# Patient Record
Sex: Female | Born: 1965
Health system: Southern US, Community
[De-identification: ages and names within clinical notes are randomized; demographics above are authoritative.]

## PROBLEM LIST (undated history)

## (undated) DIAGNOSIS — Z8742 Personal history of other diseases of the female genital tract: Secondary | ICD-10-CM

## (undated) DIAGNOSIS — B379 Candidiasis, unspecified: Secondary | ICD-10-CM

## (undated) DIAGNOSIS — N920 Excessive and frequent menstruation with regular cycle: Secondary | ICD-10-CM

## (undated) DIAGNOSIS — E538 Deficiency of other specified B group vitamins: Secondary | ICD-10-CM

## (undated) DIAGNOSIS — Z8759 Personal history of other complications of pregnancy, childbirth and the puerperium: Secondary | ICD-10-CM

## (undated) DIAGNOSIS — Z8489 Family history of other specified conditions: Secondary | ICD-10-CM

## (undated) DIAGNOSIS — D649 Anemia, unspecified: Secondary | ICD-10-CM

## (undated) DIAGNOSIS — A749 Chlamydial infection, unspecified: Secondary | ICD-10-CM

## (undated) DIAGNOSIS — R011 Cardiac murmur, unspecified: Secondary | ICD-10-CM

## (undated) DIAGNOSIS — E669 Obesity, unspecified: Secondary | ICD-10-CM

## (undated) DIAGNOSIS — B009 Herpesviral infection, unspecified: Secondary | ICD-10-CM

## (undated) DIAGNOSIS — N979 Female infertility, unspecified: Secondary | ICD-10-CM

## (undated) HISTORY — DX: Obesity, unspecified: E66.9

## (undated) HISTORY — PX: LAPAROSCOPIC GELPORT ASSISTED MYOMECTOMY: SHX6549

## (undated) HISTORY — DX: Herpesviral infection, unspecified: B00.9

## (undated) HISTORY — DX: Female infertility, unspecified: N97.9

## (undated) HISTORY — PX: COLONOSCOPY: SHX174

## (undated) HISTORY — DX: Chlamydial infection, unspecified: A74.9

## (undated) HISTORY — DX: Personal history of other complications of pregnancy, childbirth and the puerperium: Z87.59

## (undated) HISTORY — PX: DILATION AND CURETTAGE OF UTERUS: SHX78

## (undated) HISTORY — PX: TUBAL LIGATION: SHX77

## (undated) HISTORY — DX: Personal history of other diseases of the female genital tract: Z87.42

## (undated) HISTORY — DX: Excessive and frequent menstruation with regular cycle: N92.0

## (undated) HISTORY — DX: Candidiasis, unspecified: B37.9

## (undated) HISTORY — PX: SALPINGECTOMY: SHX328

## (undated) HISTORY — DX: Deficiency of other specified B group vitamins: E53.8

## (undated) HISTORY — DX: Anemia, unspecified: D64.9

## (undated) SURGERY — Surgical Case
Anesthesia: *Unknown

---

## 1998-12-20 ENCOUNTER — Other Ambulatory Visit: Admission: RE | Admit: 1998-12-20 | Discharge: 1998-12-20 | Payer: Self-pay | Admitting: Obstetrics & Gynecology

## 2000-12-17 ENCOUNTER — Other Ambulatory Visit: Admission: RE | Admit: 2000-12-17 | Discharge: 2000-12-17 | Payer: Self-pay | Admitting: Obstetrics and Gynecology

## 2003-07-24 ENCOUNTER — Other Ambulatory Visit: Admission: RE | Admit: 2003-07-24 | Discharge: 2003-07-24 | Payer: Self-pay | Admitting: Obstetrics and Gynecology

## 2004-09-22 ENCOUNTER — Other Ambulatory Visit: Admission: RE | Admit: 2004-09-22 | Discharge: 2004-09-22 | Payer: Self-pay | Admitting: Obstetrics and Gynecology

## 2006-01-22 ENCOUNTER — Other Ambulatory Visit: Admission: RE | Admit: 2006-01-22 | Discharge: 2006-01-22 | Payer: Self-pay | Admitting: Obstetrics and Gynecology

## 2006-02-17 ENCOUNTER — Ambulatory Visit (HOSPITAL_COMMUNITY): Admission: RE | Admit: 2006-02-17 | Discharge: 2006-02-17 | Payer: Self-pay | Admitting: Obstetrics and Gynecology

## 2006-02-18 ENCOUNTER — Inpatient Hospital Stay (HOSPITAL_COMMUNITY): Admission: RE | Admit: 2006-02-18 | Discharge: 2006-02-18 | Payer: Self-pay | Admitting: Family Medicine

## 2006-02-19 ENCOUNTER — Inpatient Hospital Stay (HOSPITAL_COMMUNITY): Admission: AD | Admit: 2006-02-19 | Discharge: 2006-02-19 | Payer: Self-pay | Admitting: Obstetrics and Gynecology

## 2006-02-22 ENCOUNTER — Inpatient Hospital Stay (HOSPITAL_COMMUNITY): Admission: AD | Admit: 2006-02-22 | Discharge: 2006-02-22 | Payer: Self-pay | Admitting: Obstetrics and Gynecology

## 2006-02-25 ENCOUNTER — Inpatient Hospital Stay (HOSPITAL_COMMUNITY): Admission: AD | Admit: 2006-02-25 | Discharge: 2006-02-25 | Payer: Self-pay | Admitting: Obstetrics and Gynecology

## 2006-03-01 ENCOUNTER — Encounter: Admission: RE | Admit: 2006-03-01 | Discharge: 2006-03-01 | Payer: Self-pay | Admitting: Obstetrics and Gynecology

## 2006-03-04 ENCOUNTER — Inpatient Hospital Stay (HOSPITAL_COMMUNITY): Admission: AD | Admit: 2006-03-04 | Discharge: 2006-03-04 | Payer: Self-pay | Admitting: Obstetrics and Gynecology

## 2006-03-11 ENCOUNTER — Inpatient Hospital Stay (HOSPITAL_COMMUNITY): Admission: AD | Admit: 2006-03-11 | Discharge: 2006-03-11 | Payer: Self-pay | Admitting: Obstetrics and Gynecology

## 2009-05-01 ENCOUNTER — Emergency Department (HOSPITAL_COMMUNITY): Admission: EM | Admit: 2009-05-01 | Discharge: 2009-05-01 | Payer: Self-pay | Admitting: Emergency Medicine

## 2010-05-04 ENCOUNTER — Encounter: Payer: Self-pay | Admitting: Obstetrics and Gynecology

## 2010-06-30 LAB — DIFFERENTIAL
Basophils Absolute: 0 10*3/uL (ref 0.0–0.1)
Eosinophils Absolute: 0 10*3/uL (ref 0.0–0.7)
Lymphocytes Relative: 10 % — ABNORMAL LOW (ref 12–46)
Lymphs Abs: 0.9 10*3/uL (ref 0.7–4.0)
Monocytes Absolute: 0.5 10*3/uL (ref 0.1–1.0)

## 2010-06-30 LAB — URINALYSIS, ROUTINE W REFLEX MICROSCOPIC
Glucose, UA: NEGATIVE mg/dL
Hgb urine dipstick: NEGATIVE
Ketones, ur: 40 mg/dL — AB
Urobilinogen, UA: 0.2 mg/dL (ref 0.0–1.0)
pH: 8 (ref 5.0–8.0)

## 2010-06-30 LAB — POCT I-STAT, CHEM 8
BUN: 5 mg/dL — ABNORMAL LOW (ref 6–23)
Calcium, Ion: 1.13 mmol/L (ref 1.12–1.32)
Hemoglobin: 12.9 g/dL (ref 12.0–15.0)
Sodium: 140 mEq/L (ref 135–145)
TCO2: 24 mmol/L (ref 0–100)

## 2010-06-30 LAB — GLUCOSE, CAPILLARY: Glucose-Capillary: 66 mg/dL — ABNORMAL LOW (ref 70–99)

## 2010-06-30 LAB — CBC
HCT: 36 % (ref 36.0–46.0)
Hemoglobin: 11.8 g/dL — ABNORMAL LOW (ref 12.0–15.0)
MCV: 89.8 fL (ref 78.0–100.0)
Platelets: 222 10*3/uL (ref 150–400)
RBC: 4.01 MIL/uL (ref 3.87–5.11)
RDW: 14.2 % (ref 11.5–15.5)

## 2010-06-30 LAB — POCT PREGNANCY, URINE: Preg Test, Ur: NEGATIVE

## 2010-08-29 NOTE — Consult Note (Signed)
NAME:  Sarah Villa, Sarah Villa                  ACCOUNT NO.:  [id number]   MEDICAL RECORD NO.:  [id number]          PATIENT TYPE:  MAT   LOCATION:  [facility]                          FACILITY:  [facility]   PHYSICIAN:  Janine Limbo, M.D.DATE OF BIRTH:  1965-07-16   DATE OF CONSULTATION:  02/18/2006  DATE OF DISCHARGE:                                   CONSULTATION   HISTORY OF PRESENT ILLNESS:  Ms. Mccardle is a 45 year old female, gravida 7,  para 0-0-6-0, who presents with vaginal spotting, right lower quadrant pain,  and an early pregnancy test.  The patient has been followed at the [facility] division of [facility].  The patient's  last normal menstrual period was in September of 2007.  She had spotting on  January 22, 2006 that she thought was her cycle.  She continued to have  spotting off and on since that time.  She began having right lower quadrant  pain and presented to her family physician for evaluation.  The patient's  past history is significant for four prior ectopic pregnancies.  The patient  has had a left salpingectomy because of an ectopic pregnancy.  The patient  has a long history of infertility.  She has been evaluated by reproductive  endocrinologist.  They found the patient to have a right hydrosalpinx.  They  recommended that she have her right fallopian tube removed and that the  patient pursue in vitro fertilization.  The patient decided against that  recommendation and has simply not used contraception since that time.  The  patient denies nausea and vomiting.  She denies feelings of dizziness or  faintness.   OBSTETRICAL HISTORY:  The patient has had four ectopic pregnancies, as we  previously mentioned, and she has had two miscarriages.   DRUG ALLERGIES:  NO KNOWN DRUG ALLERGIES.   PAST MEDICAL HISTORY:  The patient has a history of ectopic pregnancies, as  mentioned above.  She denies hypertension and diabetes.   CURRENT MEDICATIONS:   Multivitamins, but nothing else.   SOCIAL HISTORY:  The patient is married.  She denies cigarette use, alcohol  use, and recreational drug use.   REVIEW OF SYSTEMS:  The patient has normal GI and GU function.   FAMILY HISTORY:  Noncontributory.   PHYSICAL EXAMINATION:  VITAL SIGNS:  Temperature is 98.6, pulse 77,  respirations 20, blood pressure is 116/83.  HEENT:  Within normal limits.  CHEST:  Clear.  HEART:  Regular rate and rhythm.  ABDOMEN:  Nontender.  EXTREMITIES:  Grossly normal.  PELVIC:  External genitalia is normal.  Vagina is normal.  Cervix is  nontender.  The uterus is upper limits normal size and nontender.  Adnexa:  There is fullness in the right adnexa but no discrete masses can be  palpated.  Rectovaginal exam deferred.   LABORATORY VALUES:  Blood type is A positive.  Quantitative HCG is 229.  Hemoglobin is 11.9, hematocrit is 35.6%, white blood cell count is 5400,  platelet count is 280,000.  SGOT is 19, BUN is 6, creatinine is  0.7.   An ultrasound showed a 3.9 x 2.3 x 2.2-cm complex mass in the right adnexa.  No intrauterine gestation was noted.  There was a thin stripe in the  endometrial cavity.  The right ovary appeared normal.   ASSESSMENT:  Right adnexal mass in a patient with a quantitative human  chorionic gonadotropin of 229.  Past history of multiple ectopic pregnancies  on the left and status post left salpingectomy.  Must rule out ectopic  pregnancy.   PLAN:  A long discussion was held with the patient and her husband.  The  patient is well aware of the natural history of ectopic pregnancies.  We  reviewed treatment options which included laparoscopy with salpingostomy,  laparoscopy with salpingectomy, observation, and methotrexate therapy.  The  patient very much desires to keep a normal intrauterine gestation.  She  understands that in the current clinical situation that it is unlikely that  she has a normal intrauterine gestation.   Nevertheless, she wants to be as  sure and she can be that there is no possibility that this will result in a  viable pregnancy.  She is willing to repeat her quantitative HCG on February 19, 2006.  She feels that if that quantitative HCG value is decreasing then  she will be ready to proceed with dilatation and curettage and methotrexate  therapy.  She also understands that if that HCG value is slightly elevated  for elevated in a more appropriate fashion, then a repeat HCG value may be  required on February 20, 2006 before a more definitive decision can be made.  The patient understands that between now and the time of definitive  information that her adnexal mass may measure 4 cm or greater at which point  methotrexate may not be recommended.  She understands at that point that  surgical management would be our option of choice.  She desires not to have  laparoscopy if at all possible, but she is willing to accept this risk to  better document that this is not a viable intrauterine gestation.  The  patient further understands that there can be worsening or rupture of the  ectopic pregnancy between now and then, and she knows to call for any  increase in pain or bleeding.  She will take ibuprofen, Tylenol, or Naprosyn  as needed for pain.  The patient was asked not to eat anything after 7  o'clock a.m. on February 19, 2006 in case surgery may be required.  The  results of the patient's quantitative HCG on February 19, 2006 will be called  to Dr. Pennie Rushing.  The patient will remain in maternity admissions at [facility] so that Dr. Pennie Rushing can discuss the results of the test and so that  further plans can be made based on those results.   In addition to the above, the patient was offered laparoscopy today so that  we could better assess visually the adnexa and in addition so that we could  proceed with salpingectomy since this was recommended in preparation for in vitro fertilization.   The patient and her husband have decided that they do  not want to proceed with salpingectomy unless absolutely necessary and that  it is likely that they will not pursue in vitro fertilization any further.      Janine Limbo, M.D.  Electronically Signed     AVS/MEDQ  D:  02/18/2006  T:  02/19/2006  Job:  [id number]   cc:  Stacie Acres Cliffton Asters, M.D.  Fax: [phone]   Hal Morales, M.D.  Fax: [phone]

## 2011-12-01 ENCOUNTER — Telehealth: Payer: Self-pay | Admitting: Obstetrics and Gynecology

## 2011-12-01 NOTE — Telephone Encounter (Signed)
Pt called complaining of spotting with iud. No heavy flow/changing pad every hour and no pelvic or abdominal pain. Informed patient that it is normal to have irregular bleeding/spotting with IUD. Advised pt to check for IUD string regularly and to call office if any pelvic/adominal pain and or heavy bleeding. Pt voices understanding Marily Memos willard, cma.

## 2012-02-03 ENCOUNTER — Ambulatory Visit (INDEPENDENT_AMBULATORY_CARE_PROVIDER_SITE_OTHER): Payer: PRIVATE HEALTH INSURANCE | Admitting: Obstetrics and Gynecology

## 2012-02-03 ENCOUNTER — Encounter: Payer: Self-pay | Admitting: Obstetrics and Gynecology

## 2012-02-03 VITALS — BP 112/68 | Ht 65.0 in | Wt 210.0 lb

## 2012-02-03 DIAGNOSIS — B379 Candidiasis, unspecified: Secondary | ICD-10-CM | POA: Insufficient documentation

## 2012-02-03 DIAGNOSIS — A749 Chlamydial infection, unspecified: Secondary | ICD-10-CM

## 2012-02-03 DIAGNOSIS — D649 Anemia, unspecified: Secondary | ICD-10-CM | POA: Insufficient documentation

## 2012-02-03 DIAGNOSIS — N920 Excessive and frequent menstruation with regular cycle: Secondary | ICD-10-CM

## 2012-02-03 DIAGNOSIS — N6459 Other signs and symptoms in breast: Secondary | ICD-10-CM

## 2012-02-03 DIAGNOSIS — R922 Inconclusive mammogram: Secondary | ICD-10-CM

## 2012-02-03 DIAGNOSIS — Z8742 Personal history of other diseases of the female genital tract: Secondary | ICD-10-CM

## 2012-02-03 DIAGNOSIS — Z8759 Personal history of other complications of pregnancy, childbirth and the puerperium: Secondary | ICD-10-CM | POA: Insufficient documentation

## 2012-02-03 DIAGNOSIS — B009 Herpesviral infection, unspecified: Secondary | ICD-10-CM

## 2012-02-03 DIAGNOSIS — Z124 Encounter for screening for malignant neoplasm of cervix: Secondary | ICD-10-CM

## 2012-02-03 NOTE — Progress Notes (Signed)
ANNUAL:  Last Pap: 04/26/2011 WNL: Yes Regular Periods:no Contraception: IUD  Monthly Breast exam:yes Tetanus<60yrs:yes Nl.Bladder Function:yes Daily BMs:yes Healthy Diet:yes Calcium:no Mammogram:yes Date of Mammogram: 01/2012 Exercise:yes Have often Exercise: occasional Seatbelt: yes Abuse at home: no Stressful work:yes Sigmoid-colonoscopy: n/a Bone Density: No PCP: Dr. Laurann Montana Change in PMH: None Change in [facility]: None  Subjective:    Sarah Villa is a 46 y.o. female, G5P0050, who presents for an annual exam.     History   Social History  . Marital Status: Single    Spouse Name: N/A    Number of Children: N/A  . Years of Education: N/A   Social History Main Topics  . Smoking status: Never Smoker   . Smokeless tobacco: None  . Alcohol Use: No  . Drug Use: No  . Sexually Active: Yes    Birth Control/ Protection: IUD   Other Topics Concern  . None   Social History Narrative  . None    Menstrual cycle:   LMP: No LMP recorded. Patient is not currently having periods (Reason: IUD).           Cycle: Faint cycle only.  No cramps The following portions of the patient's history were reviewed and updated as appropriate: allergies, current medications, past family history, past medical history, past social history, past surgical history and problem list.  Review of Systems Pertinent items are noted in HPI. Breast:Negative for breast lump,nipple discharge or nipple retraction Gastrointestinal: Negative for abdominal pain, change in bowel habits or rectal bleeding Urinary:negative   Objective:    BP 112/68  Ht 5\' 5"  (1.651 m)  Wt 210 lb (95.255 kg)  BMI 34.95 kg/m2    Weight:  Wt Readings from Last 1 Encounters:  02/03/12 210 lb (95.255 kg)          BMI: Body mass index is 34.95 kg/(m^2).  General Appearance: Alert, appropriate appearance for age. No acute distress HEENT: Grossly normal Neck / Thyroid: Supple, no masses, nodes or enlargement Lungs:  clear to auscultation bilaterally Back: No CVA tenderness Breast Exam: No masses or nodes.No dimpling, nipple retraction or discharge. Cardiovascular: Regular rate and rhythm. S1, S2, no murmur Gastrointestinal: Soft, non-tender, no masses or organomegaly Pelvic Exam: External genitalia: normal general appearance Vaginal: normal rugae Cervix: IUD string visualized Adnexa: no masses Uterus: normal single, nontender Rectovaginal:normal rectal, no masses Lymphatic Exam: Non-palpable nodes in neck, clavicular, axillary, or inguinal regions Skin: no rash or abnormalities Neurologic: Normal gait and speech, no tremor  Psychiatric: Alert and oriented, appropriate affect.   Wet Prep:not applicable Urinalysis:not applicable UPT: Not done   Assessment:    Normal gyn exam  Amenorrhea  with Mirena  Plan:    mammogram pap smear with HR HPV return annually or prn STD screening: declined Contraception:IUD Mirena   Dierdre Forth MD

## 2012-02-05 LAB — PAP IG AND HPV HIGH-RISK

## 2013-11-20 ENCOUNTER — Other Ambulatory Visit: Payer: Self-pay | Admitting: Obstetrics and Gynecology

## 2013-12-13 ENCOUNTER — Encounter (HOSPITAL_COMMUNITY): Payer: Self-pay | Admitting: *Deleted

## 2013-12-20 ENCOUNTER — Ambulatory Visit (HOSPITAL_COMMUNITY): Payer: 59

## 2013-12-20 ENCOUNTER — Encounter (HOSPITAL_COMMUNITY)
Admission: RE | Disposition: A | Discharge status: Home / Self Care | Payer: Self-pay | Source: Ambulatory Visit | Attending: Obstetrics and Gynecology

## 2013-12-20 ENCOUNTER — Ambulatory Visit (HOSPITAL_COMMUNITY): Payer: 59 | Admitting: Anesthesiology

## 2013-12-20 ENCOUNTER — Ambulatory Visit (HOSPITAL_COMMUNITY)
Admission: RE | Admit: 2013-12-20 | Discharge: 2013-12-20 | Disposition: A | Discharge status: Home / Self Care | Payer: 59 | Source: Ambulatory Visit | Attending: Obstetrics and Gynecology | Admitting: Obstetrics and Gynecology

## 2013-12-20 ENCOUNTER — Encounter (HOSPITAL_COMMUNITY): Payer: Self-pay | Admitting: Obstetrics and Gynecology

## 2013-12-20 ENCOUNTER — Encounter (HOSPITAL_COMMUNITY): Payer: 59 | Admitting: Anesthesiology

## 2013-12-20 DIAGNOSIS — N920 Excessive and frequent menstruation with regular cycle: Secondary | ICD-10-CM | POA: Diagnosis present

## 2013-12-20 DIAGNOSIS — Z5309 Procedure and treatment not carried out because of other contraindication: Secondary | ICD-10-CM | POA: Diagnosis not present

## 2013-12-20 DIAGNOSIS — J069 Acute upper respiratory infection, unspecified: Secondary | ICD-10-CM | POA: Insufficient documentation

## 2013-12-20 LAB — CBC
HEMATOCRIT: 37.7 % (ref 36.0–46.0)
Hemoglobin: 12.1 g/dL (ref 12.0–15.0)
MCH: 28.7 pg (ref 26.0–34.0)
MCHC: 32.1 g/dL (ref 30.0–36.0)
MCV: 89.3 fL (ref 78.0–100.0)
Platelets: 274 10*3/uL (ref 150–400)
RBC: 4.22 MIL/uL (ref 3.87–5.11)
RDW: 14.2 % (ref 11.5–15.5)
WBC: 8 10*3/uL (ref 4.0–10.5)

## 2013-12-20 LAB — PREGNANCY, URINE: Preg Test, Ur: NEGATIVE

## 2013-12-20 SURGERY — DILATATION & CURETTAGE/HYSTEROSCOPY WITH MYOSURE
Anesthesia: General

## 2013-12-20 MED ORDER — SCOPOLAMINE 1 MG/3DAYS TD PT72: 1.0000 | MEDICATED_PATCH | Freq: Once | TRANSDERMAL | Status: DC

## 2013-12-20 MED ORDER — LACTATED RINGERS IV SOLN: INTRAVENOUS | Status: DC

## 2013-12-20 SURGICAL SUPPLY — 20 items
BOOTIES KNEE HIGH SLOAN (MISCELLANEOUS) IMPLANT
CANISTER SUCT 3000ML (MISCELLANEOUS) IMPLANT
CATH ROBINSON RED A/P 16FR (CATHETERS) IMPLANT
CLOTH BEACON ORANGE TIMEOUT ST (SAFETY) IMPLANT
CONTAINER PREFILL 10% NBF 60ML (FORM) IMPLANT
DEVICE MYOSURE CLASSIC (MISCELLANEOUS) IMPLANT
DEVICE MYOSURE LITE (MISCELLANEOUS) IMPLANT
DILATOR CANAL MILEX (MISCELLANEOUS) IMPLANT
DRAPE HYSTEROSCOPY (DRAPE) IMPLANT
ELECT REM PT RETURN 9FT ADLT (ELECTROSURGICAL)
ELECTRODE REM PT RTRN 9FT ADLT (ELECTROSURGICAL) IMPLANT
FILTER ARTHROSCOPY CONVERTOR (FILTER) IMPLANT
GLOVE SURG SS PI 6.5 STRL IVOR (GLOVE) IMPLANT
GOWN STRL REUS W/TWL LRG LVL3 (GOWN DISPOSABLE) IMPLANT
PACK VAGINAL MINOR WOMEN LF (CUSTOM PROCEDURE TRAY) IMPLANT
PAD OB MATERNITY 4.3X12.25 (PERSONAL CARE ITEMS) IMPLANT
SEAL ROD LENS SCOPE MYOSURE (ABLATOR) IMPLANT
SET TUBING HYSTEROSCOPY 2 NDL (TUBING) IMPLANT
TOWEL OR 17X24 6PK STRL BLUE (TOWEL DISPOSABLE) IMPLANT
TUBE HYSTEROSCOPY W Y-CONNECT (TUBING) IMPLANT

## 2013-12-20 NOTE — Progress Notes (Signed)
Patient to Radiology for chest X Ray per Dr. Caralee Ates order.  Husband present with her.

## 2013-12-20 NOTE — H&P (Signed)
Sarah Villa is an 48 y.o. female. Presents for hysteroscopic resection of endometrial lesion.  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 Also had tubal ligation of remaining tube  Last mammogram: normal with heterogeneously dense breasts Date: 2015 Last pap: nl PAP, neg HR HPV Date: 2013 OB History: G5, P0     Menstrual History: Patient's last menstrual period was 11/12/2013.    Past Medical History  Diagnosis Date  . History of ectopic pregnancy   . Menorrhagia   . History of infertility, female   . Chlamydia   . Herpes   . Yeast infection   . Anemia   . Infertility, female     Past Surgical History  Procedure Laterality Date  . Salpingectomy    . Dilation and curettage of uterus    . Tubal ligation      Family History  Problem Relation Age of Onset  . Hypertension Mother   . Cancer Maternal Grandmother   . Heart disease Paternal Grandmother     Social History:  reports that she has never smoked. She does not have any smokeless tobacco history on file. She reports that she drinks alcohol. She reports that she does not use illicit drugs.  Allergies: No Known Allergies  No prescriptions prior to admission    Review of Systems  Constitutional:       Pt has been treated with antibiotics and prednisone for recent URI and is now afebrile  HENT: Negative.   Eyes: Negative.   Respiratory: Positive for cough and sputum production. Negative for hemoptysis, shortness of breath and wheezing.   Cardiovascular: Negative.   Gastrointestinal: Negative.   Genitourinary: Negative.   Musculoskeletal: Negative.   Skin: Negative.   Neurological: Negative.   Endo/Heme/Allergies:  Negative.   Psychiatric/Behavioral: Negative.     Last menstrual period 11/12/2013. Physical Exam  Constitutional: She is oriented to person, place, and time. She appears well-developed and well-nourished.  HENT:  Head: Normocephalic and atraumatic.  Eyes: Conjunctivae and EOM are normal.  Neck: Normal range of motion. Neck supple.  Cardiovascular: Normal rate and regular rhythm.   GI: Soft. Bowel sounds are normal.  Genitourinary:  Pelvic exam: VULVA: normal appearing vulva with no masses, tenderness or lesions, VAGINA: normal appearing vagina with normal color and discharge, no lesions, CERVIX: normal appearing cervix without discharge or lesions, IUD string noted, UTERUS: uterus is normal size, shape, consistency and nontender, enlarged to upper limits of normal size, irregular, ADNEXA: normal adnexa in size, nontender and no masses.  Musculoskeletal: Normal range of motion.  Neurological: She is alert and oriented to person, place, and time.  Skin: Skin is warm and dry.  Psychiatric: She has a normal mood and affect.    Results for orders placed during the hospital encounter of 12/20/13 (from the past 24 hour(s))  PREGNANCY, URINE     Status: None   Collection Time    12/20/13 11:50 AM      Result Value Ref Range   Preg Test, Ur NEGATIVE  NEGATIVE  CBC     Status: None   Collection Time    12/20/13 12:10 PM      Result Value Ref Range   WBC 8.0  4.0 - 10.5 K/uL   RBC 4.22  3.87 - 5.11 MIL/uL  Hemoglobin 12.1  12.0 - 15.0 g/dL   HCT 37.7  36.0 - 46.0 %   MCV 89.3  78.0 - 100.0 fL   MCH 28.7  26.0 - 34.0 pg   MCHC 32.1  30.0 - 36.0 g/dL   RDW 14.2  11.5 - 15.5 %   Platelets 274  150 - 400 K/uL    Dg Chest 2 View  12/20/2013   CLINICAL DATA:  Congestion, upper respiratory infection.  EXAM: CHEST  2 VIEW  COMPARISON:  None.  FINDINGS: The heart size and mediastinal contours are within normal limits. Both lungs are clear. The visualized skeletal structures are unremarkable.   IMPRESSION: No active cardiopulmonary disease.   Electronically Signed   By: Rolm Baptise M.D.   On: 12/20/2013 13:00   In office hysteroscopy:  Intracavitary mass, probable fibroid, 3.5 cm with >50%  within the endometrial cavity  Assessment/Plan: Menorrhagia resistant to Mirena use Probable intracavitary fibroid  Recommendation: Hysteroscopic resection of the intracavitary mass.  Indications, risks and benefits reviewed including risk of uterine perforation and need for discontinuation of procedure if that happens.  Pt has Mirena in place that may require removal during procedure. On presentation. The patient was noted to have had an upper respiratory infection for approximately 2 weeks. She is currently on her second course of antibiotics and has completed a 3 day course of oral steroids. Upon review by anesthesia. The recommendation was made to delay the patient's surgery because of the pulmonary risks of general anesthesia in the setting of an acute upper respiratory infection. The patient underwent a chest x-ray, which showed no pneumonia, and no acute pulmonary disease. She will followup with her primary care physician. HAYGOOD,VANESSA P 12/20/2013, 3:48 PM

## 2013-12-20 NOTE — Anesthesia Preprocedure Evaluation (Deleted)
Anesthesia Evaluation  Patient identified by MRN, date of birth, ID band Patient awake    Reviewed: Allergy & Precautions, H&P , Patient's Chart, lab work & pertinent test results  Airway Mallampati: II TM Distance: >3 FB Neck ROM: full    Dental   Pulmonary  breath sounds clear to auscultation        Cardiovascular Rhythm:regular Rate:Normal     Neuro/Psych    GI/Hepatic   Endo/Other    Renal/GU      Musculoskeletal   Abdominal   Peds  Hematology  (+) anemia ,   Anesthesia Other Findings   Reproductive/Obstetrics                           Anesthesia Physical Anesthesia Plan  ASA: II  Anesthesia Plan: General LMA   Post-op Pain Management:    Induction:   Airway Management Planned:   Additional Equipment:   Intra-op Plan:   Post-operative Plan:   Informed Consent: I have reviewed the patients History and Physical, chart, labs and discussed the procedure including the risks, benefits and alternatives for the proposed anesthesia with the patient or authorized representative who has indicated his/her understanding and acceptance.     Plan Discussed with:   Anesthesia Plan Comments:         Anesthesia Quick Evaluation

## 2014-02-06 ENCOUNTER — Other Ambulatory Visit: Payer: Self-pay | Admitting: Obstetrics and Gynecology

## 2014-02-12 ENCOUNTER — Encounter (HOSPITAL_COMMUNITY): Payer: Self-pay | Admitting: Obstetrics and Gynecology

## 2014-02-16 ENCOUNTER — Encounter (HOSPITAL_COMMUNITY): Payer: Self-pay | Admitting: *Deleted

## 2014-02-19 ENCOUNTER — Other Ambulatory Visit: Payer: Self-pay | Admitting: Obstetrics and Gynecology

## 2014-02-19 DIAGNOSIS — D250 Submucous leiomyoma of uterus: Secondary | ICD-10-CM | POA: Diagnosis present

## 2014-02-19 NOTE — H&P (Signed)
Expand All Collapse All   Sarah Villa is an 48 y.o. female. Presents for hysteroscopic resection of endometrial lesion.  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 Also had tubal ligation of remaining tube  Last mammogram: normal with heterogeneously dense breasts Date: 2015 Last pap: nl PAP, neg HR HPV Date: 2013 OB History: G5, P0   Menstrual History: Patient's last menstrual period was     Past Medical History  Diagnosis Date  . History of ectopic pregnancy   . Menorrhagia   . History of infertility, female   . Chlamydia   . Herpes   . Yeast infection   . Anemia   . Infertility, female     Past Surgical History  Procedure Laterality Date  . Salpingectomy    . Dilation and curettage of uterus    . Tubal ligation      Family History  Problem Relation Age of Onset  . Hypertension Mother   . Cancer Maternal Grandmother   . Heart disease Paternal Grandmother     Social History:  reports that she has never smoked. She does not have any smokeless tobacco history on file. She reports that she drinks alcohol. She reports that she does not use illicit drugs.  Allergies: No Known Allergies  No prescriptions prior to admission    Review of Systems  Constitutional:   Pt has been treated with antibiotics and prednisone for  URI inSeptember and is now afebrile and without any recurrent symptoms HENT: Negative.  Eyes: Negative.  Respiratory:  Negative for hemoptysis, shortness of breath and wheezing.  Cardiovascular: Negative.  Gastrointestinal: Negative.  Genitourinary: Negative.   Musculoskeletal: Negative.  Skin: Negative.  Neurological: Negative.  Endo/Heme/Allergies: Negative.  Psychiatric/Behavioral: Negative.    Last menstrual period  Physical Exam  Constitutional: She is oriented to person, place, and time. She appears well-developed and well-nourished.  HENT:  Head: Normocephalic and atraumatic.  Eyes: Conjunctivae and EOM are normal.  Neck: Normal range of motion. Neck supple.  Cardiovascular: Normal rate and regular rhythm.  GI: Soft. Bowel sounds are normal.  Genitourinary:  Pelvic exam: VULVA: normal appearing vulva with no masses, tenderness or lesions, VAGINA: normal appearing vagina with normal color and discharge, no lesions, CERVIX: normal appearing cervix without discharge or lesions, IUD string noted, UTERUS: uterus is normal size, shape, consistency and nontender, enlarged to upper limits of normal size, irregular, ADNEXA: normal adnexa in size, nontender and no masses.  Musculoskeletal: Normal range of motion.  Neurological: She is alert and oriented to person, place, and time.  Skin: Skin is warm and dry.  Psychiatric: She has a normal mood and affect.  Results for orders placed or performed during the hospital encounter of 02/20/14 (from the past 24 hour(s))  Pregnancy, urine     Status: None   Collection Time: 02/20/14  8:12 AM  Result Value Ref Range   Preg Test, Ur NEGATIVE NEGATIVE  CBC     Status: Abnormal   Collection Time: 02/20/14  8:15 AM  Result Value Ref Range   WBC 5.4 4.0 - 10.5 K/uL   RBC 4.18 3.87 - 5.11 MIL/uL   Hemoglobin 11.8 (L) 12.0 - 15.0 g/dL   HCT 37.1 36.0 - 46.0 %   MCV 88.8 78.0 - 100.0 fL   MCH  28.2 26.0 - 34.0 pg   MCHC 31.8 30.0 - 36.0 g/dL   RDW 13.9 11.5 - 15.5 %   Platelets 243 150 - 400 K/uL         Results for orders placed during the hospital encounter of 12/20/13 (from the past 24 hour(s))  PREGNANCY, URINE Status: None   Collection Time   12/20/13 11:50 AM    Result Value Ref Range   Preg Test, Ur NEGATIVE  NEGATIVE  CBC Status: None   Collection Time   12/20/13 12:10 PM   Result Value Ref Range   WBC 8.0  4.0 - 10.5 K/uL   RBC 4.22  3.87 - 5.11 MIL/uL   Hemoglobin 12.1  12.0 - 15.0 g/dL   HCT 37.7  36.0 - 46.0 %   MCV 89.3  78.0 - 100.0 fL   MCH 28.7  26.0 - 34.0 pg   MCHC 32.1  30.0 - 36.0 g/dL   RDW 14.2  11.5 - 15.5 %   Platelets 274  150 - 400 K/uL       Imaging Results    Dg Chest 2 View  12/20/2013 CLINICAL DATA: Congestion, upper respiratory infection. EXAM: CHEST 2 VIEW COMPARISON: None. FINDINGS: The heart size and mediastinal contours are within normal limits. Both lungs are clear. The visualized skeletal structures are unremarkable. IMPRESSION: No active cardiopulmonary disease. Electronically Signed By: Rolm Baptise M.D. On: 12/20/2013 13:00    In office sonohysterogram: Intracavitary mass, probable fibroid, 3.5 cm with >50% within the endometrial cavity  Assessment/Plan: Menorrhagia resistant to Mirena use Probable intracavitary fibroid  Recommendation: Hysteroscopic resection of the intracavitary mass is recommended and accepted. . Indications, risks and benefits reviewed including risk of uterine perforation and need for discontinuation of procedure if that happens.  There is also the possibility that the fibroid may be larger than expected and require more than 1 procedure for complete resection. Pt has Mirena in place that may require removal during procedure, or that string may be removed during resection.  She understands and wishes to proceed

## 2014-02-20 ENCOUNTER — Ambulatory Visit (HOSPITAL_COMMUNITY)
Admission: RE | Admit: 2014-02-20 | Discharge: 2014-02-20 | Disposition: A | Discharge status: Home / Self Care | Payer: 59 | Source: Ambulatory Visit | Attending: Obstetrics and Gynecology | Admitting: Obstetrics and Gynecology

## 2014-02-20 ENCOUNTER — Encounter (HOSPITAL_COMMUNITY): Payer: Self-pay | Admitting: Anesthesiology

## 2014-02-20 ENCOUNTER — Encounter (HOSPITAL_COMMUNITY)
Admission: RE | Disposition: A | Discharge status: Home / Self Care | Payer: Self-pay | Source: Ambulatory Visit | Attending: Obstetrics and Gynecology

## 2014-02-20 ENCOUNTER — Ambulatory Visit (HOSPITAL_COMMUNITY): Payer: 59 | Admitting: Anesthesiology

## 2014-02-20 DIAGNOSIS — D259 Leiomyoma of uterus, unspecified: Secondary | ICD-10-CM | POA: Diagnosis not present

## 2014-02-20 DIAGNOSIS — Z975 Presence of (intrauterine) contraceptive device: Secondary | ICD-10-CM | POA: Diagnosis not present

## 2014-02-20 DIAGNOSIS — D649 Anemia, unspecified: Secondary | ICD-10-CM | POA: Diagnosis not present

## 2014-02-20 DIAGNOSIS — A6000 Herpesviral infection of urogenital system, unspecified: Secondary | ICD-10-CM | POA: Insufficient documentation

## 2014-02-20 DIAGNOSIS — N920 Excessive and frequent menstruation with regular cycle: Secondary | ICD-10-CM | POA: Insufficient documentation

## 2014-02-20 DIAGNOSIS — D250 Submucous leiomyoma of uterus: Secondary | ICD-10-CM | POA: Diagnosis present

## 2014-02-20 HISTORY — PX: DILATATION & CURETTAGE/HYSTEROSCOPY WITH MYOSURE: SHX6511

## 2014-02-20 LAB — CBC
HEMATOCRIT: 37.1 % (ref 36.0–46.0)
Hemoglobin: 11.8 g/dL — ABNORMAL LOW (ref 12.0–15.0)
MCH: 28.2 pg (ref 26.0–34.0)
MCHC: 31.8 g/dL (ref 30.0–36.0)
MCV: 88.8 fL (ref 78.0–100.0)
Platelets: 243 10*3/uL (ref 150–400)
RBC: 4.18 MIL/uL (ref 3.87–5.11)
RDW: 13.9 % (ref 11.5–15.5)
WBC: 5.4 10*3/uL (ref 4.0–10.5)

## 2014-02-20 LAB — PREGNANCY, URINE: Preg Test, Ur: NEGATIVE

## 2014-02-20 SURGERY — DILATATION & CURETTAGE/HYSTEROSCOPY WITH MYOSURE
Anesthesia: General | Site: Vagina

## 2014-02-20 MED ORDER — MEPERIDINE HCL 25 MG/ML IJ SOLN: 6.2500 mg | INTRAMUSCULAR | Status: DC | PRN

## 2014-02-20 MED ORDER — KETOROLAC TROMETHAMINE 60 MG/2ML IM SOLN
INTRAMUSCULAR | Status: DC | PRN
  Administered 2014-02-20: 30 mg via INTRAMUSCULAR

## 2014-02-20 MED ORDER — ONDANSETRON HCL 4 MG/2ML IJ SOLN
INTRAMUSCULAR | Status: AC
  Filled 2014-02-20: qty 2

## 2014-02-20 MED ORDER — PROPOFOL 10 MG/ML IV EMUL
INTRAVENOUS | Status: AC
  Filled 2014-02-20: qty 20

## 2014-02-20 MED ORDER — VASOPRESSIN 20 UNIT/ML IV SOLN
INTRAVENOUS | Status: AC
  Filled 2014-02-20: qty 1

## 2014-02-20 MED ORDER — CEFAZOLIN (ANCEF) 1 G IV SOLR: 2.0000 g | INTRAVENOUS | Status: DC

## 2014-02-20 MED ORDER — LIDOCAINE HCL 2 % IJ SOLN
INTRAMUSCULAR | Status: AC
  Filled 2014-02-20: qty 20

## 2014-02-20 MED ORDER — SCOPOLAMINE 1 MG/3DAYS TD PT72
MEDICATED_PATCH | TRANSDERMAL | Status: AC
  Administered 2014-02-20: 1.5 mg via TRANSDERMAL
  Filled 2014-02-20: qty 1

## 2014-02-20 MED ORDER — CEFAZOLIN SODIUM-DEXTROSE 2-3 GM-% IV SOLR
2.0000 g | Freq: Once | INTRAVENOUS | Status: AC
  Administered 2014-02-20: 2 g via INTRAVENOUS
  Filled 2014-02-20: qty 50

## 2014-02-20 MED ORDER — PROMETHAZINE HCL 25 MG/ML IJ SOLN
INTRAMUSCULAR | Status: AC
  Filled 2014-02-20: qty 1

## 2014-02-20 MED ORDER — FENTANYL CITRATE 0.05 MG/ML IJ SOLN
INTRAMUSCULAR | Status: DC | PRN
  Administered 2014-02-20 (×2): 25 ug via INTRAVENOUS
  Administered 2014-02-20: 50 ug via INTRAVENOUS

## 2014-02-20 MED ORDER — SODIUM CHLORIDE 0.9 % IJ SOLN
INTRAMUSCULAR | Status: AC
  Filled 2014-02-20: qty 50

## 2014-02-20 MED ORDER — FENTANYL CITRATE 0.05 MG/ML IJ SOLN
25.0000 ug | INTRAMUSCULAR | Status: DC | PRN
  Administered 2014-02-20: 50 ug via INTRAVENOUS

## 2014-02-20 MED ORDER — LIDOCAINE HCL (CARDIAC) 20 MG/ML IV SOLN
INTRAVENOUS | Status: AC
  Filled 2014-02-20: qty 5

## 2014-02-20 MED ORDER — GLYCOPYRROLATE 0.2 MG/ML IJ SOLN
INTRAMUSCULAR | Status: DC | PRN
  Administered 2014-02-20: 0.1 mg via INTRAVENOUS

## 2014-02-20 MED ORDER — KETOROLAC TROMETHAMINE 30 MG/ML IJ SOLN
INTRAMUSCULAR | Status: AC
Start: 2014-02-20 — End: 2014-02-20
  Filled 2014-02-20: qty 1

## 2014-02-20 MED ORDER — DEXAMETHASONE SODIUM PHOSPHATE 10 MG/ML IJ SOLN
INTRAMUSCULAR | Status: AC
  Filled 2014-02-20: qty 1

## 2014-02-20 MED ORDER — FENTANYL CITRATE 0.05 MG/ML IJ SOLN
INTRAMUSCULAR | Status: AC
  Filled 2014-02-20: qty 5

## 2014-02-20 MED ORDER — FENTANYL CITRATE 0.05 MG/ML IJ SOLN
INTRAMUSCULAR | Status: AC
  Filled 2014-02-20: qty 2

## 2014-02-20 MED ORDER — KETOROLAC TROMETHAMINE 30 MG/ML IJ SOLN: 15.0000 mg | Freq: Once | INTRAMUSCULAR | Status: DC | PRN

## 2014-02-20 MED ORDER — HYDROCODONE-ACETAMINOPHEN 5-325 MG PO TABS: 1.0000 | ORAL_TABLET | Freq: Four times a day (QID) | ORAL | Status: DC | PRN

## 2014-02-20 MED ORDER — KETOROLAC TROMETHAMINE 30 MG/ML IJ SOLN
INTRAMUSCULAR | Status: DC | PRN
  Administered 2014-02-20: 30 mg via INTRAVENOUS

## 2014-02-20 MED ORDER — SODIUM CHLORIDE 0.9 % IV SOLN
INTRAVENOUS | Status: DC | PRN
  Administered 2014-02-20: 20 mL via INTRAMUSCULAR

## 2014-02-20 MED ORDER — DEXAMETHASONE SODIUM PHOSPHATE 10 MG/ML IJ SOLN
INTRAMUSCULAR | Status: DC | PRN
  Administered 2014-02-20: 10 mg via INTRAVENOUS

## 2014-02-20 MED ORDER — LACTATED RINGERS IV SOLN
INTRAVENOUS | Status: DC
  Administered 2014-02-20 (×2): via INTRAVENOUS

## 2014-02-20 MED ORDER — SCOPOLAMINE 1 MG/3DAYS TD PT72
1.0000 | MEDICATED_PATCH | Freq: Once | TRANSDERMAL | Status: DC
  Administered 2014-02-20: 1.5 mg via TRANSDERMAL

## 2014-02-20 MED ORDER — SODIUM CHLORIDE 0.9 % IR SOLN
Status: DC | PRN
  Administered 2014-02-20 (×4): 3000 mL

## 2014-02-20 MED ORDER — IBUPROFEN 600 MG PO TABS: ORAL_TABLET | ORAL | Status: DC

## 2014-02-20 MED ORDER — PROMETHAZINE HCL 25 MG PO TABS: 25.0000 mg | ORAL_TABLET | Freq: Four times a day (QID) | ORAL | Status: DC | PRN

## 2014-02-20 MED ORDER — EPHEDRINE SULFATE 50 MG/ML IJ SOLN
INTRAMUSCULAR | Status: DC | PRN
  Administered 2014-02-20 (×4): 5 mg via INTRAVENOUS

## 2014-02-20 MED ORDER — MIDAZOLAM HCL 5 MG/5ML IJ SOLN
INTRAMUSCULAR | Status: DC | PRN
  Administered 2014-02-20: 2 mg via INTRAVENOUS

## 2014-02-20 MED ORDER — MIDAZOLAM HCL 2 MG/2ML IJ SOLN: 0.5000 mg | Freq: Once | INTRAMUSCULAR | Status: DC | PRN

## 2014-02-20 MED ORDER — PROMETHAZINE HCL 25 MG/ML IJ SOLN: 6.2500 mg | INTRAMUSCULAR | Status: DC | PRN

## 2014-02-20 MED ORDER — LIDOCAINE HCL (CARDIAC) 20 MG/ML IV SOLN
INTRAVENOUS | Status: DC | PRN
  Administered 2014-02-20: 40 mg via INTRAVENOUS

## 2014-02-20 MED ORDER — CEFAZOLIN SODIUM-DEXTROSE 2-3 GM-% IV SOLR
INTRAVENOUS | Status: AC
  Filled 2014-02-20: qty 50

## 2014-02-20 MED ORDER — LIDOCAINE HCL 2 % IJ SOLN
INTRAMUSCULAR | Status: DC | PRN
  Administered 2014-02-20: 10 mL

## 2014-02-20 MED ORDER — PROPOFOL 10 MG/ML IV BOLUS
INTRAVENOUS | Status: DC | PRN
  Administered 2014-02-20: 150 mg via INTRAVENOUS

## 2014-02-20 MED ORDER — ONDANSETRON HCL 4 MG/2ML IJ SOLN
INTRAMUSCULAR | Status: DC | PRN
  Administered 2014-02-20: 4 mg via INTRAVENOUS

## 2014-02-20 MED ORDER — PROMETHAZINE HCL 25 MG/ML IJ SOLN
6.2500 mg | INTRAMUSCULAR | Status: DC | PRN
  Administered 2014-02-20: 6.25 mg via INTRAVENOUS

## 2014-02-20 MED ORDER — MIDAZOLAM HCL 2 MG/2ML IJ SOLN
INTRAMUSCULAR | Status: AC
  Filled 2014-02-20: qty 2

## 2014-02-20 MED ORDER — EPHEDRINE 5 MG/ML INJ
INTRAVENOUS | Status: AC
  Filled 2014-02-20: qty 10

## 2014-02-20 SURGICAL SUPPLY — 24 items
BOOTIES KNEE HIGH SLOAN (MISCELLANEOUS) ×4 IMPLANT
CANISTER SUCT 3000ML (MISCELLANEOUS) ×10 IMPLANT
CATH ROBINSON RED A/P 16FR (CATHETERS) ×2 IMPLANT
CLOTH BEACON ORANGE TIMEOUT ST (SAFETY) ×2 IMPLANT
CONTAINER PREFILL 10% NBF 60ML (FORM) ×4 IMPLANT
DECANTER SPIKE VIAL GLASS SM (MISCELLANEOUS) ×4 IMPLANT
DEVICE MYOSURE CLASSIC (MISCELLANEOUS) IMPLANT
DEVICE MYOSURE LITE (MISCELLANEOUS) IMPLANT
DILATOR CANAL MILEX (MISCELLANEOUS) IMPLANT
ELECT REM PT RETURN 9FT ADLT (ELECTROSURGICAL) ×2
ELECTRODE REM PT RTRN 9FT ADLT (ELECTROSURGICAL) ×1 IMPLANT
FILTER ARTHROSCOPY CONVERTOR (FILTER) ×2 IMPLANT
GLOVE SURG SS PI 6.5 STRL IVOR (GLOVE) ×4 IMPLANT
GOWN STRL REUS W/TWL LRG LVL3 (GOWN DISPOSABLE) ×4 IMPLANT
MYOSURE XL FIBROID REM (MISCELLANEOUS) ×2
PACK VAGINAL MINOR WOMEN LF (CUSTOM PROCEDURE TRAY) ×2 IMPLANT
PAD OB MATERNITY 4.3X12.25 (PERSONAL CARE ITEMS) ×2 IMPLANT
SEAL ROD LENS SCOPE MYOSURE (ABLATOR) ×2 IMPLANT
SET TUBING HYSTEROSCOPY 2 NDL (TUBING) IMPLANT
SYR TB 1ML 25GX5/8 (SYRINGE) ×2 IMPLANT
SYRINGE CONTROL L 12CC (SYRINGE) ×2 IMPLANT
SYSTEM TISS REMOVAL MYSR XL RM (MISCELLANEOUS) ×1 IMPLANT
TOWEL OR 17X24 6PK STRL BLUE (TOWEL DISPOSABLE) ×4 IMPLANT
TUBE HYSTEROSCOPY W Y-CONNECT (TUBING) IMPLANT

## 2014-02-20 NOTE — Op Note (Signed)
02/20/2014  11:09 AM  PATIENT:  Sarah Villa  48 y.o. female  PRE-OPERATIVE DIAGNOSIS:  Endometrial mass, Menorrhagia  POST-OPERATIVE DIAGNOSIS:  Endometrial mass, Menorrhagia,  intracavitary fibroid  PROCEDURE:  Procedure(s): DILATATION & CURETTAGE/HYSTEROSCOPY WITH MYOSURE RESECTION OF FIBROID  SURGEON:  Nanette Wirsing P, MD  ASSISTANTS: NONE  ANESTHESIA:   general  LMA  ESTIMATED BLOOD LOSS: 20 cc  BLOOD ADMINISTERED:none  COMPLICATIONS:none  FINDINGS: the uterus sounded to 10 cm. There was a 3-4 cm uterine fibroid emanating from the left lateral wall of the endometrial cavity which was almost completely relieved within the endometrial cavity. There was a smaller right fundal uterine fibroid measuring approximately 1 cm. The tubal ostia could be visualized after resection of the large fibroid. A Mirena intrauterine contraceptive device was in place in the endometrial cavity.  FLUID DEFICIT:  1590cc  . IRRIGANT USED:  NORMAL SALINE FOR A TOTAL OF 5 three liter bags  LOCAL MEDICATIONS USED:  XYLOCAINE  and Amount: 10 ml  OTHER INTRAOPERATIVE MEDICATIONS: Ancef 2 g IV on-call to the operating room        Toradol 30 mg IV and 30 mg IM near the termination of the case        Pitressin 20 units in 50 cc of saline in a paracervical location for a total of 10 cc prior to beginning the resection  SPECIMEN:  Source of Specimen:  uterine fibroids  DISPOSITION OF SPECIMEN:  PATHOLOGY  COUNTS:  YES  DESCRIPTION OF PROCEDURE:the patient was taken to the operating room after appropriate identification and placed on the operating table. After the attainment of adequate general anesthesia she was placed in the lithotomy position. The perineum and vagina were prepped with multiple layers of Betadine. The bladder was emptied with a an in and out catheter. The perineum was draped in sterile field. A gray speculum was placed in the vagina. The cervix was grasped with a single-tooth  tenaculum. A paracervical block was achieved with a total of 10 cc of 2% Xylocaine and the 5 and 7:00 positions.  10 cc of dilute Pitressin was injected into a similar paracervical space. The uterus was sounded to 10 cm.  The cervix was then dilated to accommodate the diagnostic hysteroscope. The hysteroscope was used to evaluate all quadrants of the uterus.  The above-noted findings were made and documented. The Myosure apparatus was placed through the outflow channel of the diagnostic Myosure hysteroscope and resection of the small myoma undertaken to completion. The large myoma was then resected over the next 17 minutes of cut time.  During this procedure. The string attached to the IUD was suctioned into the Myosure apparatus and cut. However, the IUD was not damaged.  At completion of the resection. Both tubal ostia could then be visualized and hemostasis was noted to be adequate. All instruments were then removed from the endometrial cavity . After documentation of the resection was performed. All instruments were then removed from the vagina and the patient was awakened from general anesthesia and taken to the recovery room in satisfactory condition having tolerated the procedure well sponge and instrument counts correct. The IUD was left in place as had been discussed with the patient in the event that the string was cut for removal at its appropriate five-year limit. She understands this may require an office hysteroscopy for removal.  PLAN OF CARE: discharge home after postanesthesia care  PATIENT DISPOSITION:  PACU - hemodynamically stable.   Delay start of Pharmacological VTE agent (>  24hrs) due to surgical blood loss or risk of bleeding:  not applicable.  SCD hose used during the procedure.   Eldred Manges, MD 11:09 AM

## 2014-02-20 NOTE — Discharge Instructions (Signed)
DISCHARGE INSTRUCTIONS: HYSTEROSCOPY / ENDOMETRIAL ABLATION The following instructions have been prepared to help you care for yourself upon your return home.  May remove Scop patch on or before 02/22/2014.  May take Ibuprofen after 4:40 p.m. As needed for cramps  Personal hygiene:  Use sanitary pads for vaginal drainage, not tampons.  Shower the day after your procedure.  NO tub baths, pools or Jacuzzis for 2-3 weeks.  Wipe front to back after using the bathroom.  Activity and limitations:  Do NOT drive or operate any equipment for 24 hours. The effects of anesthesia are still present and drowsiness may result.  Do NOT rest in bed all day.  Walking is encouraged.  Walk up and down stairs slowly.  You may resume your normal activity in one to two days or as indicated by your physician. Sexual activity: NO intercourse for at least 2 weeks after the procedure, or as indicated by your Doctor.  Diet: Eat a light meal as desired this evening. You may resume your usual diet tomorrow.  Return to Work: You may resume your work activities in one to two days or as indicated by Marine scientist.  What to expect after your surgery: Expect to have vaginal bleeding/discharge for 2-3 days and spotting for up to 10 days. It is not unusual to have soreness for up to 1-2 weeks. You may have a slight burning sensation when you urinate for the first day. Mild cramps may continue for a couple of days. You may have a regular period in 2-6 weeks.  Call your doctor for any of the following:  Excessive vaginal bleeding or clotting, saturating and changing one pad every hour.  Inability to urinate 6 hours after discharge from hospital.  Pain not relieved by pain medication.  Fever of 100.4 F or greater.  Unusual vaginal discharge or odor.  Return to office _________________Call for an appointment ___________________ Patients signature: ______________________ Nurses signature  ________________________  [facility] [phone]

## 2014-02-20 NOTE — Transfer of Care (Signed)
Immediate Anesthesia Transfer of Care Note  Patient: Sarah Villa  Procedure(s) Performed: Procedure(s): DILATATION & CURETTAGE/HYSTEROSCOPY WITH MYOSURE (N/A)  Patient Location: PACU  Anesthesia Type:General  Level of Consciousness: awake, alert  and oriented  Airway & Oxygen Therapy: Patient Spontanous Breathing and Patient connected to nasal cannula oxygen  Post-op Assessment: Report given to PACU RN and Post -op Vital signs reviewed and stable  Post vital signs: Reviewed and stable  Complications: No apparent anesthesia complications

## 2014-02-20 NOTE — Anesthesia Preprocedure Evaluation (Addendum)
Anesthesia Evaluation  Patient identified by MRN, date of birth, ID band Patient awake    Reviewed: Allergy & Precautions, H&P , NPO status , Patient's Chart, lab work & pertinent test results, reviewed documented beta blocker date and time   History of Anesthesia Complications Negative for: history of anesthetic complications  Airway Mallampati: I  TM Distance: >3 FB Neck ROM: full    Dental  (+) Teeth Intact   Pulmonary neg pulmonary ROS,  breath sounds clear to auscultation  Pulmonary exam normal       Cardiovascular negative cardio ROS  Rhythm:regular Rate:Normal     Neuro/Psych negative neurological ROS  negative psych ROS   GI/Hepatic negative GI ROS, Neg liver ROS,   Endo/Other  BMI 36.1  Renal/GU negative Renal ROS  Female GU complaint     Musculoskeletal   Abdominal   Peds  Hematology  (+) anemia ,   Anesthesia Other Findings   Reproductive/Obstetrics negative OB ROS                            Anesthesia Physical  Anesthesia Plan  ASA: II  Anesthesia Plan: General LMA   Post-op Pain Management:    Induction:   Airway Management Planned:   Additional Equipment:   Intra-op Plan:   Post-operative Plan:   Informed Consent: I have reviewed the patients History and Physical, chart, labs and discussed the procedure including the risks, benefits and alternatives for the proposed anesthesia with the patient or authorized representative who has indicated his/her understanding and acceptance.   Dental Advisory Given  Plan Discussed with: CRNA and Surgeon  Anesthesia Plan Comments:         Anesthesia Quick Evaluation

## 2014-02-20 NOTE — Anesthesia Postprocedure Evaluation (Signed)
  Anesthesia Post Note  Patient: Sarah Villa  Procedure(s) Performed: Procedure(s) (LRB): DILATATION & CURETTAGE/HYSTEROSCOPY WITH MYOSURE (N/A)  Anesthesia type: GA  Patient location: PACU  Post pain: Pain level controlled  Post assessment: Post-op Vital signs reviewed  Last Vitals:  Filed Vitals:   02/20/14 1200  BP: 115/66  Pulse: 79  Temp:   Resp: 18    Post vital signs: Reviewed  Level of consciousness: sedated  Complications: No apparent anesthesia complications

## 2014-02-21 ENCOUNTER — Encounter (HOSPITAL_COMMUNITY): Payer: Self-pay | Admitting: Obstetrics and Gynecology

## 2014-03-02 ENCOUNTER — Encounter (HOSPITAL_COMMUNITY): Payer: Self-pay | Admitting: Obstetrics and Gynecology

## 2014-09-13 ENCOUNTER — Encounter: Payer: Self-pay | Admitting: Dietician

## 2014-09-13 ENCOUNTER — Encounter: Payer: 59 | Attending: Family Medicine | Admitting: Dietician

## 2014-09-13 VITALS — Ht 64.0 in | Wt 211.5 lb

## 2014-09-13 DIAGNOSIS — E669 Obesity, unspecified: Secondary | ICD-10-CM | POA: Diagnosis not present

## 2014-09-13 NOTE — Progress Notes (Signed)
Medical Nutrition Therapy: Visit start time: 1300  end time: 1400  Assessment:  Diagnosis: obesity Past medical history: no chronic issues per pt Psychosocial issues/ stress concerns: none Preferred learning method:  . No preference indicated  Current weight: 211.5lbs  Height: 5'4" Medications, supplements: Vitamin D3, Belviq Progress and evaluation: Patient reports making lifestyle changes about 1-2 months ago for weight loss. She reports loss of about 8lbs thus far.         She reports significant decrease in carbohydrate intake, and increase in vegetable intake. Also fewer fried foods, and better portion control. Physical activity: walking 30-45 minutes, 2-3 times a week  Dietary Intake:  Usual eating pattern includes 2-3 meals and 1-2 snacks per day. Dining out frequency: 3-5  meals per week.  Breakfast: scrambled (with veg) or boiled egg, with [place] or pork bacon; or oatmeal (instant); coffee Snack: fruit or yogurt Lunch: 1-2pm sometimes misses due to work; salad or broccoli, grilled chicken Snack: occasionally goldfish crackers or pretzels, fruit Supper: 8-9pm grilled tilapia, chicken, chick fil a salad no fries, panera soup and salad.  Snack: usually none due to late supper Beverages: mostly water, coffee  Nutrition Care Education: Topics covered: weight control Basic nutrition: basic food groups, appropriate nutrient balance, appropriate meal and snack schedule, general nutrition guidelines    Weight control: behavioral changes for weight loss; 1300kcal meal plan with 40%CHO, 30%protein, 30% fat; basic carb needs and examples of balanced meals. Advanced nutrition:  cooking techniques,planning ahead and batch cooking to save time and allow for earlier dinners. Other lifestyle changes:  Exercise, options to increase strength training at home  Nutritional Diagnosis:  Sarah Villa-3.3 Overweight/obesity As related to excess carb intake and large food portions, inactivity.  As evidenced by  patient report, BMI 36.  Intervention: Instruction as noted above.    Advised pt to include at least 8 carb servings daily, or 2-3 servings each meal; encouraged whole grains, beans, fruits.   Encouraged pt to plan meals ahead of time to allow for better balanced meals at home, more meals at home, and earlier dinner meals.   Encouraged pt to add a few minutes of strength training exercises several times a week.  Education Materials given:  . Plate Planner . Food lists/ Planning A Balanced Meal . Sample meal pattern/ menus: Quick and Healthy Meal Ideas  Learner/ who was taught:  . Patient   Level of understanding: Marland Kitchen Verbalizes/ demonstrates competency  Demonstrated degree of understanding via:   Teach back Learning barriers: . None  Willingness to learn/ readiness for change: . Eager, change in progress  Monitoring and Evaluation:  Dietary intake, exercise, and body weight 10/31/14

## 2014-09-13 NOTE — Patient Instructions (Signed)
Plan ahead for balanced meals; try cooking extra meats/chicken to freeze for a quicker meal next time. Include at least 2 servings of carb with each meal (30g). Continue with regular walking; consider some strength-building exercises, such as on wellness link on [employer].

## 2014-10-31 ENCOUNTER — Ambulatory Visit: Payer: 59 | Admitting: Dietician

## 2014-10-31 ENCOUNTER — Encounter: Payer: Self-pay | Admitting: *Deleted

## 2014-11-01 ENCOUNTER — Ambulatory Visit (INDEPENDENT_AMBULATORY_CARE_PROVIDER_SITE_OTHER): Payer: 59 | Admitting: Cardiovascular Disease

## 2014-11-01 ENCOUNTER — Encounter: Payer: Self-pay | Admitting: Cardiovascular Disease

## 2014-11-01 VITALS — BP 118/78 | HR 77 | Ht 64.0 in | Wt 213.0 lb

## 2014-11-01 DIAGNOSIS — R002 Palpitations: Secondary | ICD-10-CM | POA: Diagnosis not present

## 2014-11-01 DIAGNOSIS — Z01818 Encounter for other preprocedural examination: Secondary | ICD-10-CM | POA: Diagnosis not present

## 2014-11-01 DIAGNOSIS — R011 Cardiac murmur, unspecified: Secondary | ICD-10-CM | POA: Insufficient documentation

## 2014-11-01 NOTE — Progress Notes (Signed)
Cardiology Office Note   Date:  11/01/2014   ID:  Sarah Villa, DOB Jan 16, 1966, MRN [id number]  PCP:  Vidal Schwalbe, MD  Cardiologist:   Thayer Headings, MD   Chief Complaint  Patient presents with  . other    No complaints. Meds reviewed verbally with pt.   Problem List 1. Obesity - needs clearance to start Belviq   History of Present Illness: Sarah Villa is a 49 y.o. female who presents for clearance to start Belviq.    No cardiac problems  , Works out regularly ,  No dyspnea, no CP .     Past Medical History  Diagnosis Date  . History of ectopic pregnancy   . Menorrhagia   . History of infertility, female   . Chlamydia   . Herpes   . Yeast infection   . Anemia   . Infertility, female     Past Surgical History  Procedure Laterality Date  . Salpingectomy    . Dilation and curettage of uterus    . Tubal ligation    . Dilatation & curettage/hysteroscopy with myosure N/A 02/20/2014    Procedure: [place];  Surgeon: Eldred Manges, MD;  Location: [facility];  Service: Gynecology;  Laterality: N/A;     Current Outpatient Prescriptions  Medication Sig Dispense Refill  . Cholecalciferol (HM VITAMIN D3) 4000 UNITS CAPS daily.    . ferrous sulfate 325 (65 FE) MG tablet Take 325 mg by mouth daily with breakfast.    . levonorgestrel (MIRENA) 20 MCG/24HR IUD 1 each by Intrauterine route once.    Marland Kitchen levonorgestrel-ethinyl estradiol (AVIANE) 0.1-20 MG-MCG tablet Take 1 tablet by mouth daily.     Marland Kitchen loratadine-pseudoephedrine (CLARITIN-D 12-HOUR) 5-120 MG per tablet Take 1 tablet by mouth daily as needed for allergies.    . pseudoephedrine-guaifenesin (MUCINEX D) 60-600 MG per tablet Take 1 tablet by mouth as needed for congestion.     No current facility-administered medications for this visit.    Allergies:   Review of patient's allergies indicates no known allergies.    Social History:  The patient  reports that she has  never smoked. She does not have any smokeless tobacco history on file. She reports that she does not drink alcohol or use illicit drugs.   Family History:  The patient's family history includes Cancer in her maternal grandmother; Heart disease in her paternal grandmother; Hypertension in her mother and paternal grandmother.    ROS:  Please see the history of present illness.    Review of Systems: Constitutional:  denies fever, chills, diaphoresis, appetite change and fatigue.  HEENT: denies photophobia, eye pain, redness, hearing loss, ear pain, congestion, sore throat, rhinorrhea, sneezing, neck pain, neck stiffness and tinnitus.  Respiratory: denies SOB, DOE, cough, chest tightness, and wheezing.  Cardiovascular: denies chest pain, palpitations and leg swelling.  Gastrointestinal: denies nausea, vomiting, abdominal pain, diarrhea, constipation, blood in stool.  Genitourinary: denies dysuria, urgency, frequency, hematuria, flank pain and difficulty urinating.  Musculoskeletal: denies  myalgias, back pain, joint swelling, arthralgias and gait problem.   Skin: denies pallor, rash and wound.  Neurological: denies dizziness, seizures, syncope, weakness, light-headedness, numbness and headaches.   Hematological: denies adenopathy, easy bruising, personal or family bleeding history.  Psychiatric/ Behavioral: denies suicidal ideation, mood changes, confusion, nervousness, sleep disturbance and agitation.       All other systems are reviewed and negative.    PHYSICAL EXAM: VS:  BP 118/78 mmHg  Pulse 77  Ht 5\' 4"  (1.626 m)  Wt 96.616 kg (213 lb)  BMI 36.54 kg/m2 , BMI Body mass index is 36.54 kg/(m^2). GEN: Well nourished, well developed, in no acute distress HEENT: normal Neck: no JVD, carotid bruits, or masses Cardiac: RRR; she has a 1-2 / 6 systolic murmur,  No  rubs, or gallops,no edema  Respiratory:  clear to auscultation bilaterally, normal work of breathing GI: soft, nontender,  nondistended, + BS MS: no deformity or atrophy Skin: warm and dry, no rash Neuro:  Strength and sensation are intact Psych: normal   EKG:  EKG is ordered today. The ekg ordered today demonstrates NSR at 77, normal ECG    Recent Labs: 02/20/2014: Hemoglobin 11.8*; Platelets 243    Lipid Panel No results found for: CHOL, TRIG, HDL, CHOLHDL, VLDL, LDLCALC, LDLDIRECT    Wt Readings from Last 3 Encounters:  11/01/14 96.616 kg (213 lb)  09/13/14 95.936 kg (211 lb 8 oz)  02/20/14 95.255 kg (210 lb)      Other studies Reviewed: Additional studies/ records that were reviewed today include: . Review of the above records demonstrates:    ASSESSMENT AND PLAN:  1.  Cardiac clearance for Belviq: She has a very soft systolic murmur which likely is due to mild mitral regurgitation or perhaps tricuspid regurgitation. I think it is reasonable to get an echo card gram before starting the Belviq. I do not think that I'll need to see her on a long-term basis but be happy to see her if any issues arise.  If the echo shows any significant problems then we will see her accordingly.   Current medicines are reviewed at length with the patient today.  The patient does not have concerns regarding medicines.  The following changes have been made:  no change  Labs/ tests ordered today include:   Orders Placed This Encounter  Procedures  . EKG 12-Lead  . Echocardiogram     Disposition:   FU with me as needed.       Nahser, Wonda Cheng, MD  11/01/2014 4:23 PM    [facility] [place], [place], [place]  [address] Phone: [phone]; Fax: [phone]   [facility]  [address] [place], [place]  [address] [phone]   Fax [phone]

## 2014-11-01 NOTE — Patient Instructions (Signed)
Medication Instructions:  Your physician recommends that you continue on your current medications as directed. Please refer to the Current Medication list given to you today.   Labwork: none  Testing/Procedures: Your physician has requested that you have an echocardiogram. Echocardiography is a painless test that uses sound waves to create images of your heart. It provides your doctor with information about the size and shape of your heart and how well your heart's chambers and valves are working. This procedure takes approximately one hour. There are no restrictions for this procedure.    Follow-Up: Your physician recommends that you schedule a follow-up appointment with Dr. Acie Fredrickson as needed   Any Other Special Instructions Will Be Listed Below (If Applicable).  Echocardiogram An echocardiogram, or echocardiography, uses sound waves (ultrasound) to produce an image of your heart. The echocardiogram is simple, painless, obtained within a short period of time, and offers valuable information to your health care provider. The images from an echocardiogram can provide information such as:  Evidence of coronary artery disease (CAD).  Heart size.  Heart muscle function.  Heart valve function.  Aneurysm detection.  Evidence of a past heart attack.  Fluid buildup around the heart.  Heart muscle thickening.  Assess heart valve function. LET [facility] CARE PROVIDER KNOW ABOUT:  Any allergies you have.  All medicines you are taking, including vitamins, herbs, eye drops, creams, and over-the-counter medicines.  Previous problems you or members of your family have had with the use of anesthetics.  Any blood disorders you have.  Previous surgeries you have had.  Medical conditions you have.  Possibility of pregnancy, if this applies. BEFORE THE PROCEDURE  No special preparation is needed. Eat and drink normally.  PROCEDURE   In order to produce an image of your heart,  gel will be applied to your chest and a wand-like tool (transducer) will be moved over your chest. The gel will help transmit the sound waves from the transducer. The sound waves will harmlessly bounce off your heart to allow the heart images to be captured in real-time motion. These images will then be recorded.  You may need an IV to receive a medicine that improves the quality of the pictures. AFTER THE PROCEDURE You may return to your normal schedule including diet, activities, and medicines, unless your health care provider tells you otherwise. Document Released: 03/27/2000 Document Revised: 08/14/2013 Document Reviewed: 12/05/2012 [facility] Patient Information 2015 [place], [place]. This information is not intended to replace advice given to you by your health care provider. Make sure you discuss any questions you have with your health care provider.

## 2014-11-05 ENCOUNTER — Ambulatory Visit (INDEPENDENT_AMBULATORY_CARE_PROVIDER_SITE_OTHER): Payer: 59

## 2014-11-05 ENCOUNTER — Other Ambulatory Visit: Payer: Self-pay

## 2014-11-05 DIAGNOSIS — R002 Palpitations: Secondary | ICD-10-CM

## 2014-11-07 ENCOUNTER — Encounter: Payer: Self-pay | Admitting: Nurse Practitioner

## 2014-12-05 ENCOUNTER — Encounter: Payer: Self-pay | Admitting: Dietician

## 2015-03-04 ENCOUNTER — Ambulatory Visit: Payer: 59 | Admitting: Dietician

## 2015-03-18 ENCOUNTER — Encounter: Payer: 59 | Attending: Family Medicine | Admitting: Dietician

## 2015-03-18 VITALS — Wt 207.9 lb

## 2015-03-18 DIAGNOSIS — E669 Obesity, unspecified: Secondary | ICD-10-CM

## 2015-03-18 DIAGNOSIS — Z713 Dietary counseling and surveillance: Secondary | ICD-10-CM | POA: Diagnosis present

## 2015-03-18 NOTE — Patient Instructions (Signed)
   Try checking weight at least several times a week.  Continue working on tracking food intake. Aim for 1300-1500 calories daily.   Continue with regular exercise, can add some strength-building exercises such as desk push-ups and leg lifts 2-3 times a week.   Include a healthy snack during the day if needed to break a long stretch of time between meals.

## 2015-03-18 NOTE — Progress Notes (Signed)
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  She reports not doing as well as she had hoped with planning meals in advance.         She has continued to work on making low-calorie food choices and has limited carb intake.          She states she has had times of overeating also.          She has recently begun exercising on a regular basis.   Physical activity: treadmill 30-45 minutes 4 times a week  Dietary Intake:  Usual eating pattern includes 3 meals and 0 snacks per day.  Breakfast: cereal: corn flakes or cheerios (counts as 100cal) Snack: none Lunch: 12/5 grilled chicken, broccoli, peas or salad with grilled chicken Snack: none Supper: larger meal, cooks baked chicken or  at home usually, fried seafood once a week Snack: sometimes smoothie, most likely on weekends Beverages: water, some juices  Nutrition Care Education: Topics covered: weight management Basic nutrition: adequate protein and carbohydrate intake, protein sources for cereal and smoothie meals.     Weight control: effects of exercise on weight loss; i.e., often losing inches faster than pounds; snack options and appropriate portions Other lifestyle changes: light strength-building exercise options  Nutritional Diagnosis:  [place]-3.3 Overweight/obesity As related to history of excess food intake, inactivity.  As evidenced by patient report.  Intervention: Discussion as noted above.   Updated goals with patient input.     Education Materials given:  Marland Kitchen Snacking handout . Goals/ instructions   Learner/ who was taught:  . Patient   Level of understanding: Marland Kitchen Verbalizes/ demonstrates competency   Demonstrated degree of  understanding via:   Teach back Learning barriers: . None   Willingness to learn/ readiness for change: . Eager, change in progress  Monitoring and Evaluation:  Dietary intake, exercise, and body weight      follow up: 04/23/15

## 2015-03-26 ENCOUNTER — Ambulatory Visit: Payer: Self-pay | Admitting: Physician Assistant

## 2015-03-26 ENCOUNTER — Encounter: Payer: Self-pay | Admitting: Physician Assistant

## 2015-03-26 VITALS — BP 122/80 | HR 84 | Temp 98.8°F

## 2015-03-26 DIAGNOSIS — L299 Pruritus, unspecified: Secondary | ICD-10-CM

## 2015-03-26 MED ORDER — METHYLPREDNISOLONE 4 MG PO TBPK: ORAL_TABLET | ORAL | Status: DC

## 2015-03-26 NOTE — Progress Notes (Signed)
S: c/o feet itching and swelling, feel hot to touch when itching starts, ?if from boots/shoes, wears hose not socks with enclosed shoes, had a couple of other itchy areas on body but not like feet, no new exposures, no fever/chills, had a yearly physical this year  O: vitals wnl, nad, skin on plantar surface of feet warm and red b/l, no pustules or drainage, pt scratching, no hives on rest of body , n/v intact  A: itching  P: claritin, medrol dose pack, discussed possible allergens, pt to keep diary of what she's wearing and eating when itching starts

## 2015-04-23 ENCOUNTER — Ambulatory Visit: Payer: 59 | Admitting: Dietician

## 2015-05-01 DIAGNOSIS — N926 Irregular menstruation, unspecified: Secondary | ICD-10-CM | POA: Diagnosis not present

## 2015-05-01 DIAGNOSIS — Z30432 Encounter for removal of intrauterine contraceptive device: Secondary | ICD-10-CM | POA: Diagnosis not present

## 2015-05-01 DIAGNOSIS — D259 Leiomyoma of uterus, unspecified: Secondary | ICD-10-CM | POA: Diagnosis not present

## 2015-08-02 DIAGNOSIS — M170 Bilateral primary osteoarthritis of knee: Secondary | ICD-10-CM | POA: Diagnosis not present

## 2015-08-07 ENCOUNTER — Encounter: Payer: Self-pay | Admitting: Physician Assistant

## 2015-08-07 ENCOUNTER — Ambulatory Visit: Payer: Self-pay | Admitting: Physician Assistant

## 2015-08-07 VITALS — BP 120/80 | HR 82 | Temp 98.9°F

## 2015-08-07 DIAGNOSIS — J018 Other acute sinusitis: Secondary | ICD-10-CM

## 2015-08-07 MED ORDER — AMOXICILLIN 875 MG PO TABS: 875.0000 mg | ORAL_TABLET | Freq: Two times a day (BID) | ORAL | Status: DC

## 2015-08-07 MED ORDER — FLUCONAZOLE 150 MG PO TABS: 150.0000 mg | ORAL_TABLET | Freq: Once | ORAL | Status: DC

## 2015-08-07 NOTE — Progress Notes (Signed)
S: C/o runny nose and congestion for 3 days, no fever, chills, cp/sob, v/d; mucus is yellow and thick, cough is sporadic, c/o of facial and dental pain.   Using otc meds:   O: PE: vitals wnl, nad, perrl eomi, normocephalic, tms dull, nasal mucosa red and swollen, throat injected, neck supple no lymph, lungs c t a, cv rrr, neuro intact  A:  Acute sinusitis   P: amoxil 875mg  bid, diflucan if needed;  drink fluids, continue regular meds , use otc meds of choice, return if not improving in 5 days, return earlier if worsening

## 2015-09-24 DIAGNOSIS — Z6833 Body mass index (BMI) 33.0-33.9, adult: Secondary | ICD-10-CM | POA: Diagnosis not present

## 2015-09-24 DIAGNOSIS — Z713 Dietary counseling and surveillance: Secondary | ICD-10-CM | POA: Diagnosis not present

## 2015-09-24 DIAGNOSIS — E6609 Other obesity due to excess calories: Secondary | ICD-10-CM | POA: Diagnosis not present

## 2015-09-24 DIAGNOSIS — Z79899 Other long term (current) drug therapy: Secondary | ICD-10-CM | POA: Diagnosis not present

## 2015-11-13 DIAGNOSIS — Z6834 Body mass index (BMI) 34.0-34.9, adult: Secondary | ICD-10-CM | POA: Diagnosis not present

## 2015-11-13 DIAGNOSIS — R3911 Hesitancy of micturition: Secondary | ICD-10-CM | POA: Diagnosis not present

## 2015-11-13 DIAGNOSIS — N920 Excessive and frequent menstruation with regular cycle: Secondary | ICD-10-CM | POA: Diagnosis not present

## 2015-11-13 DIAGNOSIS — D259 Leiomyoma of uterus, unspecified: Secondary | ICD-10-CM | POA: Diagnosis not present

## 2015-11-13 DIAGNOSIS — N921 Excessive and frequent menstruation with irregular cycle: Secondary | ICD-10-CM | POA: Diagnosis not present

## 2015-11-13 DIAGNOSIS — Z01411 Encounter for gynecological examination (general) (routine) with abnormal findings: Secondary | ICD-10-CM | POA: Diagnosis not present

## 2015-11-13 DIAGNOSIS — R3915 Urgency of urination: Secondary | ICD-10-CM | POA: Diagnosis not present

## 2015-11-28 DIAGNOSIS — Z1231 Encounter for screening mammogram for malignant neoplasm of breast: Secondary | ICD-10-CM | POA: Diagnosis not present

## 2015-12-30 DIAGNOSIS — E6609 Other obesity due to excess calories: Secondary | ICD-10-CM | POA: Diagnosis not present

## 2015-12-30 DIAGNOSIS — Z6834 Body mass index (BMI) 34.0-34.9, adult: Secondary | ICD-10-CM | POA: Diagnosis not present

## 2015-12-30 DIAGNOSIS — Z713 Dietary counseling and surveillance: Secondary | ICD-10-CM | POA: Diagnosis not present

## 2016-08-28 DIAGNOSIS — H6122 Impacted cerumen, left ear: Secondary | ICD-10-CM | POA: Diagnosis not present

## 2016-08-28 DIAGNOSIS — Z713 Dietary counseling and surveillance: Secondary | ICD-10-CM | POA: Diagnosis not present

## 2016-08-28 DIAGNOSIS — E6609 Other obesity due to excess calories: Secondary | ICD-10-CM | POA: Diagnosis not present

## 2016-08-28 DIAGNOSIS — Z6830 Body mass index (BMI) 30.0-30.9, adult: Secondary | ICD-10-CM | POA: Diagnosis not present

## 2016-12-03 DIAGNOSIS — Z01411 Encounter for gynecological examination (general) (routine) with abnormal findings: Secondary | ICD-10-CM | POA: Diagnosis not present

## 2016-12-03 DIAGNOSIS — Z124 Encounter for screening for malignant neoplasm of cervix: Secondary | ICD-10-CM | POA: Diagnosis not present

## 2016-12-03 DIAGNOSIS — N926 Irregular menstruation, unspecified: Secondary | ICD-10-CM | POA: Diagnosis not present

## 2016-12-03 DIAGNOSIS — Z1211 Encounter for screening for malignant neoplasm of colon: Secondary | ICD-10-CM | POA: Diagnosis not present

## 2016-12-03 DIAGNOSIS — R339 Retention of urine, unspecified: Secondary | ICD-10-CM | POA: Diagnosis not present

## 2016-12-03 DIAGNOSIS — D259 Leiomyoma of uterus, unspecified: Secondary | ICD-10-CM | POA: Diagnosis not present

## 2016-12-03 DIAGNOSIS — Z1231 Encounter for screening mammogram for malignant neoplasm of breast: Secondary | ICD-10-CM | POA: Diagnosis not present

## 2016-12-03 DIAGNOSIS — Z3040 Encounter for surveillance of contraceptives, unspecified: Secondary | ICD-10-CM | POA: Diagnosis not present

## 2016-12-03 DIAGNOSIS — R3914 Feeling of incomplete bladder emptying: Secondary | ICD-10-CM | POA: Diagnosis not present

## 2016-12-07 DIAGNOSIS — N926 Irregular menstruation, unspecified: Secondary | ICD-10-CM | POA: Diagnosis not present

## 2016-12-07 DIAGNOSIS — D259 Leiomyoma of uterus, unspecified: Secondary | ICD-10-CM | POA: Diagnosis not present

## 2016-12-07 DIAGNOSIS — N920 Excessive and frequent menstruation with regular cycle: Secondary | ICD-10-CM | POA: Diagnosis not present

## 2016-12-07 DIAGNOSIS — R3914 Feeling of incomplete bladder emptying: Secondary | ICD-10-CM | POA: Diagnosis not present

## 2016-12-23 DIAGNOSIS — Z1211 Encounter for screening for malignant neoplasm of colon: Secondary | ICD-10-CM | POA: Diagnosis not present

## 2016-12-23 DIAGNOSIS — D6489 Other specified anemias: Secondary | ICD-10-CM | POA: Diagnosis not present

## 2017-01-13 DIAGNOSIS — D259 Leiomyoma of uterus, unspecified: Secondary | ICD-10-CM | POA: Diagnosis not present

## 2017-01-18 DIAGNOSIS — Z6830 Body mass index (BMI) 30.0-30.9, adult: Secondary | ICD-10-CM | POA: Diagnosis not present

## 2017-01-18 DIAGNOSIS — E559 Vitamin D deficiency, unspecified: Secondary | ICD-10-CM | POA: Diagnosis not present

## 2017-01-18 DIAGNOSIS — E669 Obesity, unspecified: Secondary | ICD-10-CM | POA: Diagnosis not present

## 2017-01-18 DIAGNOSIS — D509 Iron deficiency anemia, unspecified: Secondary | ICD-10-CM | POA: Diagnosis not present

## 2017-02-16 ENCOUNTER — Encounter: Payer: Self-pay | Admitting: Emergency Medicine

## 2017-02-16 ENCOUNTER — Emergency Department: Payer: PRIVATE HEALTH INSURANCE

## 2017-02-16 ENCOUNTER — Emergency Department
Admission: EM | Admit: 2017-02-16 | Discharge: 2017-02-16 | Disposition: A | Discharge status: Home / Self Care | Payer: PRIVATE HEALTH INSURANCE | Attending: Emergency Medicine | Admitting: Emergency Medicine

## 2017-02-16 DIAGNOSIS — Z79899 Other long term (current) drug therapy: Secondary | ICD-10-CM | POA: Insufficient documentation

## 2017-02-16 DIAGNOSIS — S6992XA Unspecified injury of left wrist, hand and finger(s), initial encounter: Secondary | ICD-10-CM | POA: Diagnosis present

## 2017-02-16 DIAGNOSIS — Y93F9 Activity, other caregiving: Secondary | ICD-10-CM | POA: Insufficient documentation

## 2017-02-16 DIAGNOSIS — S66303A Unspecified injury of extensor muscle, fascia and tendon of left middle finger at wrist and hand level, initial encounter: Secondary | ICD-10-CM | POA: Insufficient documentation

## 2017-02-16 DIAGNOSIS — Y990 Civilian activity done for income or pay: Secondary | ICD-10-CM | POA: Diagnosis not present

## 2017-02-16 DIAGNOSIS — X500XXA Overexertion from strenuous movement or load, initial encounter: Secondary | ICD-10-CM | POA: Insufficient documentation

## 2017-02-16 DIAGNOSIS — Y92239 Unspecified place in hospital as the place of occurrence of the external cause: Secondary | ICD-10-CM | POA: Insufficient documentation

## 2017-02-16 DIAGNOSIS — Y9389 Activity, other specified: Secondary | ICD-10-CM | POA: Diagnosis not present

## 2017-02-16 MED ORDER — MELOXICAM 15 MG PO TABS: 15.0000 mg | ORAL_TABLET | Freq: Every day | ORAL | 0 refills | Status: DC

## 2017-02-16 NOTE — ED Triage Notes (Signed)
Pt to ED , workers comp. Pt had another pt fall on her, pt has swelling noted to LFT 3rd digit. No deformity noted.

## 2017-02-16 NOTE — ED Notes (Signed)
Patient verbalized understanding of discharge instructions, positions and follow-up care. Ambulatory with steady gait and NAD noted upon discharge.

## 2017-02-16 NOTE — ED Notes (Signed)
WC completed and delivered to lab for courier p/u.

## 2017-02-16 NOTE — ED Provider Notes (Signed)
[facility] Emergency Department Provider Note  ____________________________________________  Time seen: Approximately 4:17 PM  I have reviewed the triage vital signs and the nursing notes.   HISTORY  Chief Complaint Finger Injury    HPI Sarah Villa is a 51 y.o. female who presents emergency department complaining of injury to the third digit of the left hand.  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  Patient tried to prevent her patient from suffering an injury from a fall.  Patient is unsure exactly how she injured her finger but has pain, as well as constant flexion of the DIP joint of the third digit left hand.  No other injury or complaint.  No medications prior to arrival in the emergency department.  No previous history of injury to the left hand.  No loss of sensation.  Past Medical History:  Diagnosis Date  . Anemia   . Chlamydia   . Herpes   . History of ectopic pregnancy   . History of infertility, female   . Infertility, female   . Menorrhagia   . Yeast infection     Patient Active Problem List   Diagnosis Date Noted  . Murmur, cardiac 11/01/2014  . Fibroids, submucosal 02/19/2014  . Breast density 02/03/2012  . History of ectopic pregnancy   . Menorrhagia   . History of infertility, female   . Yeast infection   . Anemia     Past Surgical History:  Procedure Laterality Date  . DILATION AND CURETTAGE OF UTERUS    . SALPINGECTOMY    . TUBAL LIGATION      Prior to Admission medications   Medication Sig Start Date End Date Taking? Authorizing Provider  amoxicillin (AMOXIL) 875 MG tablet Take 1 tablet (875 mg total) by mouth 2 (two) times daily. 08/07/15   Caryn Section Linden Dolin, PA-C  Cholecalciferol (HM VITAMIN D3) 4000 UNITS CAPS daily.    [provider]  CONTRAVE 8-90 MG TB12  07/18/15   [provider]  ferrous sulfate 325 (65 FE) MG tablet Take 325 mg by mouth daily  with breakfast. Reported on 08/07/2015    [provider]  fluconazole (DIFLUCAN) 150 MG tablet Take 1 tablet (150 mg total) by mouth once. 08/07/15   Versie Starks, PA-C  levonorgestrel (MIRENA) 20 MCG/24HR IUD 1 each by Intrauterine route once. Reported on 08/07/2015    [provider]  levonorgestrel-ethinyl estradiol (AVIANE) 0.1-20 MG-MCG tablet Take 1 tablet by mouth daily.     [provider]  loratadine-pseudoephedrine (CLARITIN-D 12-HOUR) 5-120 MG per tablet Take 1 tablet by mouth daily as needed for allergies. Reported on 08/07/2015    [provider]  meloxicam (MOBIC) 15 MG tablet Take 1 tablet (15 mg total) daily by mouth. 02/16/17   Yamilet Mcfayden, Charline Bills, PA-C  methylPREDNISolone (MEDROL DOSEPAK) 4 MG TBPK tablet Take 6 pills on day one then decrease by 1 pill each day Patient not taking: Reported on 08/07/2015 03/26/15   Versie Starks, PA-C  pseudoephedrine-guaifenesin [facility] D) 60-600 MG per tablet Take 1 tablet by mouth as needed for congestion.    [provider]    Allergies Patient has no known allergies.  Family History  Problem Relation Age of Onset  . Hypertension Mother   . Heart disease Paternal Grandmother   . Hypertension Paternal Grandmother   . Cancer Maternal Grandmother     Social History Social History   Tobacco  Use  . Smoking status: Never Smoker  Substance Use Topics  . Alcohol use: No    Alcohol/week: 0.6 - 1.2 oz    Types: 1 - 2 Standard drinks or equivalent per week    Comment: occasional  . Drug use: No     Review of Systems  Constitutional: No fever/chills Eyes: No visual changes.  Cardiovascular: no chest pain. Respiratory: no cough. No SOB. Gastrointestinal: No abdominal pain.  No nausea, no vomiting.   Musculoskeletal: Finger injury to the third digit of the left hand Skin: Negative for rash, abrasions, lacerations, ecchymosis. Neurological: Negative for headaches, focal weakness or  numbness. 10-point ROS otherwise negative.  ____________________________________________   PHYSICAL EXAM:  VITAL SIGNS: ED Triage Vitals  Enc Vitals Group     BP 02/16/17 1555 (!) 129/96     Pulse Rate 02/16/17 1555 (!) 103     Resp 02/16/17 1555 16     Temp 02/16/17 1555 98.7 F (37.1 C)     Temp Source 02/16/17 1555 Oral     SpO2 02/16/17 1555 99 %     Weight 02/16/17 1556 175 lb (79.4 kg)     Height 02/16/17 1556 5\' 4"  (1.626 m)     Head Circumference --      Peak Flow --      Pain Score 02/16/17 1556 0     Pain Loc --      Pain Edu? --      Excl. in [place]? --      Constitutional: Alert and oriented. Well appearing and in no acute distress. Eyes: Conjunctivae are normal. PERRL. EOMI. Head: Atraumatic. ENT:      Ears:       Nose: No congestion/rhinnorhea.      Mouth/Throat: Mucous membranes are moist.  Neck: No stridor.    Cardiovascular: Normal rate, regular rhythm. Normal S1 and S2.  Good peripheral circulation. Respiratory: Normal respiratory effort without tachypnea or retractions. Lungs CTAB. Good air entry to the bases with no decreased or absent breath sounds. Musculoskeletal: Full range of motion to all extremities. No gross deformities appreciated.  Examination of the left hand reveals constant flexion of the DIP joint of the third digit left hand.  Palpation reveals some tenderness to palpation of the DIP joint and distal phalanx.  No significant palpable abnormality.  Patient has good flexion of the digit but is unable to fully extend the DIP joint.  Sensation and cap refill intact.  No tenderness to palpation of the osseous structures of the third digit or left hand.  No palpable abnormalities.  Station and capillary refill intact all 5 digits. Neurologic:  Normal speech and language. No gross focal neurologic deficits are appreciated.  Skin:  Skin is warm, dry and intact. No rash noted. Psychiatric: Mood and affect are normal. Speech and behavior are normal.  Patient exhibits appropriate insight and judgement.   ____________________________________________   LABS (all labs ordered are listed, but only abnormal results are displayed)  Labs Reviewed - No data to display ____________________________________________  EKG   ____________________________________________  RADIOLOGY [facility] [place] Zooey Schreurs, personally viewed and evaluated these images (plain radiographs) as part of my medical decision making, as well as reviewing the written report by the radiologist.  Dg Hand Complete Left  Result Date: 02/16/2017 CLINICAL DATA:  Swelling of the left middle finger after the patient rollover on it. EXAM: LEFT HAND - COMPLETE 3+ VIEW COMPARISON:  None in PACs FINDINGS: The bones of the left hand  are subjectively adequately mineralized. There is no acute fracture or dislocation. There is mild flexion at the DIP joint of the third finger which may be acute or chronic. These soft tissues appear mildly swollen as compared to the second and fourth digits. The metacarpals and visualized portions of the carpal bones are normal. IMPRESSION: Mild soft tissue swelling of the left third digit with mild flexion at the DIP joint. No acute fracture. Electronically Signed   By: David  Martinique M.D.   On: 02/16/2017 16:37    ____________________________________________    PROCEDURES  Procedure(s) performed:    .Splint Application Date/Time: 14/07/8183 5:19 PM Performed by: Darletta Moll, PA-C Authorized by: Darletta Moll, PA-C   Consent:    Consent obtained:  Verbal   Consent given by:  Patient   Risks discussed:  Pain and swelling Pre-procedure details:    Sensation:  Normal Procedure details:    Laterality:  Left   Location:  Finger   Finger:  L long finger   Splint type:  Finger   Supplies:  Aluminum splint and elastic bandage Post-procedure details:    Pain:  Unchanged   Sensation:  Normal   Patient tolerance of procedure:   Tolerated well, no immediate complications Comments:     Second and third digit of the left hand is buddy taped.  Finger splint is then applied on the palmar aspect and secured with more tape and Ace bandage.      Medications - No data to display   ____________________________________________   INITIAL IMPRESSION / ASSESSMENT AND PLAN / ED COURSE  Pertinent labs & imaging results that were available during my care of the patient were reviewed by me and considered in my medical decision making (see chart for details).  Review of the [place] CSRS was performed in accordance of the [place] prior to dispensing any controlled drugs.     Patient's diagnosis is consistent with extensor tendon rupture of the third digit left hand.  Patient presented with finger injury with initial left dislocation versus ligament rupture.  X-ray returns with no indication of fracture or dislocation.  Patient is in chronic flexion of the DIP joint with inability to extend the DIP joint.  No open wounds or lacerations.  Finger is splinted in complete extension in the emergency department and she will be referred to hand surgery for further evaluation.. Patient will be discharged home with prescriptions for meloxicam 15 mg tablets to be taken daily. Patient is to follow up with hand surgeon as described above or primary care as needed or otherwise directed. Patient is given ED precautions to return to the ED for any worsening or new symptoms.     ____________________________________________  FINAL CLINICAL IMPRESSION(S) / ED DIAGNOSES  Final diagnoses:  Unspecified injury of extensor muscle, fascia and tendon of left middle finger at wrist and hand level, initial encounter      NEW MEDICATIONS STARTED DURING THIS VISIT:  ED Discharge Orders        Ordered    meloxicam (MOBIC) 15 MG tablet  Daily     02/16/17 1713          This chart was dictated using voice recognition software/Dragon. Despite best  efforts to proofread, errors can occur which can change the meaning. Any change was purely unintentional.    Darletta Moll, PA-C 02/16/17 1720    Hinda Kehr, MD 02/16/17 [phone]

## 2017-05-05 DIAGNOSIS — N921 Excessive and frequent menstruation with irregular cycle: Secondary | ICD-10-CM | POA: Diagnosis not present

## 2017-05-05 DIAGNOSIS — Z862 Personal history of diseases of the blood and blood-forming organs and certain disorders involving the immune mechanism: Secondary | ICD-10-CM | POA: Diagnosis not present

## 2017-05-05 DIAGNOSIS — N946 Dysmenorrhea, unspecified: Secondary | ICD-10-CM | POA: Diagnosis not present

## 2017-05-05 DIAGNOSIS — N920 Excessive and frequent menstruation with regular cycle: Secondary | ICD-10-CM | POA: Diagnosis not present

## 2017-05-05 DIAGNOSIS — Z8371 Family history of colonic polyps: Secondary | ICD-10-CM | POA: Diagnosis not present

## 2017-05-05 DIAGNOSIS — D259 Leiomyoma of uterus, unspecified: Secondary | ICD-10-CM | POA: Diagnosis not present

## 2017-08-04 DIAGNOSIS — D259 Leiomyoma of uterus, unspecified: Secondary | ICD-10-CM | POA: Diagnosis not present

## 2017-08-04 DIAGNOSIS — D649 Anemia, unspecified: Secondary | ICD-10-CM | POA: Diagnosis not present

## 2017-08-04 DIAGNOSIS — N920 Excessive and frequent menstruation with regular cycle: Secondary | ICD-10-CM | POA: Diagnosis not present

## 2017-08-04 DIAGNOSIS — N946 Dysmenorrhea, unspecified: Secondary | ICD-10-CM | POA: Diagnosis not present

## 2017-09-21 DIAGNOSIS — G5602 Carpal tunnel syndrome, left upper limb: Secondary | ICD-10-CM | POA: Diagnosis not present

## 2017-09-21 DIAGNOSIS — G5603 Carpal tunnel syndrome, bilateral upper limbs: Secondary | ICD-10-CM | POA: Diagnosis not present

## 2017-09-30 DIAGNOSIS — Z13220 Encounter for screening for lipoid disorders: Secondary | ICD-10-CM | POA: Diagnosis not present

## 2017-09-30 DIAGNOSIS — Z1211 Encounter for screening for malignant neoplasm of colon: Secondary | ICD-10-CM | POA: Diagnosis not present

## 2017-09-30 DIAGNOSIS — Z0000 Encounter for general adult medical examination without abnormal findings: Secondary | ICD-10-CM | POA: Diagnosis not present

## 2017-09-30 DIAGNOSIS — Z23 Encounter for immunization: Secondary | ICD-10-CM | POA: Diagnosis not present

## 2017-09-30 DIAGNOSIS — D509 Iron deficiency anemia, unspecified: Secondary | ICD-10-CM | POA: Diagnosis not present

## 2017-09-30 DIAGNOSIS — E559 Vitamin D deficiency, unspecified: Secondary | ICD-10-CM | POA: Diagnosis not present

## 2017-09-30 DIAGNOSIS — E669 Obesity, unspecified: Secondary | ICD-10-CM | POA: Diagnosis not present

## 2017-12-08 DIAGNOSIS — N920 Excessive and frequent menstruation with regular cycle: Secondary | ICD-10-CM | POA: Diagnosis not present

## 2017-12-08 DIAGNOSIS — Z01411 Encounter for gynecological examination (general) (routine) with abnormal findings: Secondary | ICD-10-CM | POA: Diagnosis not present

## 2017-12-08 DIAGNOSIS — Z6833 Body mass index (BMI) 33.0-33.9, adult: Secondary | ICD-10-CM | POA: Diagnosis not present

## 2017-12-08 DIAGNOSIS — Z1231 Encounter for screening mammogram for malignant neoplasm of breast: Secondary | ICD-10-CM | POA: Diagnosis not present

## 2017-12-08 DIAGNOSIS — D259 Leiomyoma of uterus, unspecified: Secondary | ICD-10-CM | POA: Diagnosis not present

## 2017-12-08 DIAGNOSIS — D649 Anemia, unspecified: Secondary | ICD-10-CM | POA: Diagnosis not present

## 2017-12-21 DIAGNOSIS — S63522D Sprain of radiocarpal joint of left wrist, subsequent encounter: Secondary | ICD-10-CM | POA: Diagnosis not present

## 2017-12-21 DIAGNOSIS — G5603 Carpal tunnel syndrome, bilateral upper limbs: Secondary | ICD-10-CM | POA: Diagnosis not present

## 2017-12-30 DIAGNOSIS — G5603 Carpal tunnel syndrome, bilateral upper limbs: Secondary | ICD-10-CM | POA: Diagnosis not present

## 2018-01-14 DIAGNOSIS — Z1211 Encounter for screening for malignant neoplasm of colon: Secondary | ICD-10-CM | POA: Diagnosis not present

## 2018-01-14 DIAGNOSIS — K5730 Diverticulosis of large intestine without perforation or abscess without bleeding: Secondary | ICD-10-CM | POA: Diagnosis not present

## 2018-09-13 DIAGNOSIS — E669 Obesity, unspecified: Secondary | ICD-10-CM | POA: Diagnosis not present

## 2018-11-23 DIAGNOSIS — B079 Viral wart, unspecified: Secondary | ICD-10-CM | POA: Diagnosis not present

## 2018-11-23 DIAGNOSIS — L918 Other hypertrophic disorders of the skin: Secondary | ICD-10-CM | POA: Diagnosis not present

## 2018-11-23 DIAGNOSIS — L821 Other seborrheic keratosis: Secondary | ICD-10-CM | POA: Diagnosis not present

## 2018-12-14 DIAGNOSIS — E6609 Other obesity due to excess calories: Secondary | ICD-10-CM | POA: Diagnosis not present

## 2018-12-14 DIAGNOSIS — R030 Elevated blood-pressure reading, without diagnosis of hypertension: Secondary | ICD-10-CM | POA: Diagnosis not present

## 2019-01-10 DIAGNOSIS — E6609 Other obesity due to excess calories: Secondary | ICD-10-CM | POA: Diagnosis not present

## 2019-01-10 DIAGNOSIS — Z6833 Body mass index (BMI) 33.0-33.9, adult: Secondary | ICD-10-CM | POA: Diagnosis not present

## 2019-01-10 DIAGNOSIS — D509 Iron deficiency anemia, unspecified: Secondary | ICD-10-CM | POA: Diagnosis not present

## 2019-01-10 DIAGNOSIS — E559 Vitamin D deficiency, unspecified: Secondary | ICD-10-CM | POA: Diagnosis not present

## 2019-01-11 DIAGNOSIS — E559 Vitamin D deficiency, unspecified: Secondary | ICD-10-CM | POA: Diagnosis not present

## 2019-01-11 DIAGNOSIS — Z0000 Encounter for general adult medical examination without abnormal findings: Secondary | ICD-10-CM | POA: Diagnosis not present

## 2019-01-11 DIAGNOSIS — D509 Iron deficiency anemia, unspecified: Secondary | ICD-10-CM | POA: Diagnosis not present

## 2019-01-11 DIAGNOSIS — E6609 Other obesity due to excess calories: Secondary | ICD-10-CM | POA: Diagnosis not present

## 2019-01-20 DIAGNOSIS — Z6834 Body mass index (BMI) 34.0-34.9, adult: Secondary | ICD-10-CM | POA: Diagnosis not present

## 2019-01-20 DIAGNOSIS — Z01419 Encounter for gynecological examination (general) (routine) without abnormal findings: Secondary | ICD-10-CM | POA: Diagnosis not present

## 2019-01-20 DIAGNOSIS — Z124 Encounter for screening for malignant neoplasm of cervix: Secondary | ICD-10-CM | POA: Diagnosis not present

## 2019-01-20 DIAGNOSIS — Z1231 Encounter for screening mammogram for malignant neoplasm of breast: Secondary | ICD-10-CM | POA: Diagnosis not present

## 2019-01-20 DIAGNOSIS — R87610 Atypical squamous cells of undetermined significance on cytologic smear of cervix (ASC-US): Secondary | ICD-10-CM | POA: Diagnosis not present

## 2019-02-15 ENCOUNTER — Ambulatory Visit (INDEPENDENT_AMBULATORY_CARE_PROVIDER_SITE_OTHER): Payer: Self-pay | Admitting: Family Medicine

## 2019-02-28 IMAGING — DX DG HAND COMPLETE 3+V*L*
3 series · 3 of 3 positions shown · non-contrast
Comparison: None in PACs

CLINICAL DATA: Swelling of the left middle finger after the patient
rollover on it.

EXAM:
LEFT HAND - COMPLETE 3+ VIEW

[hand ap]
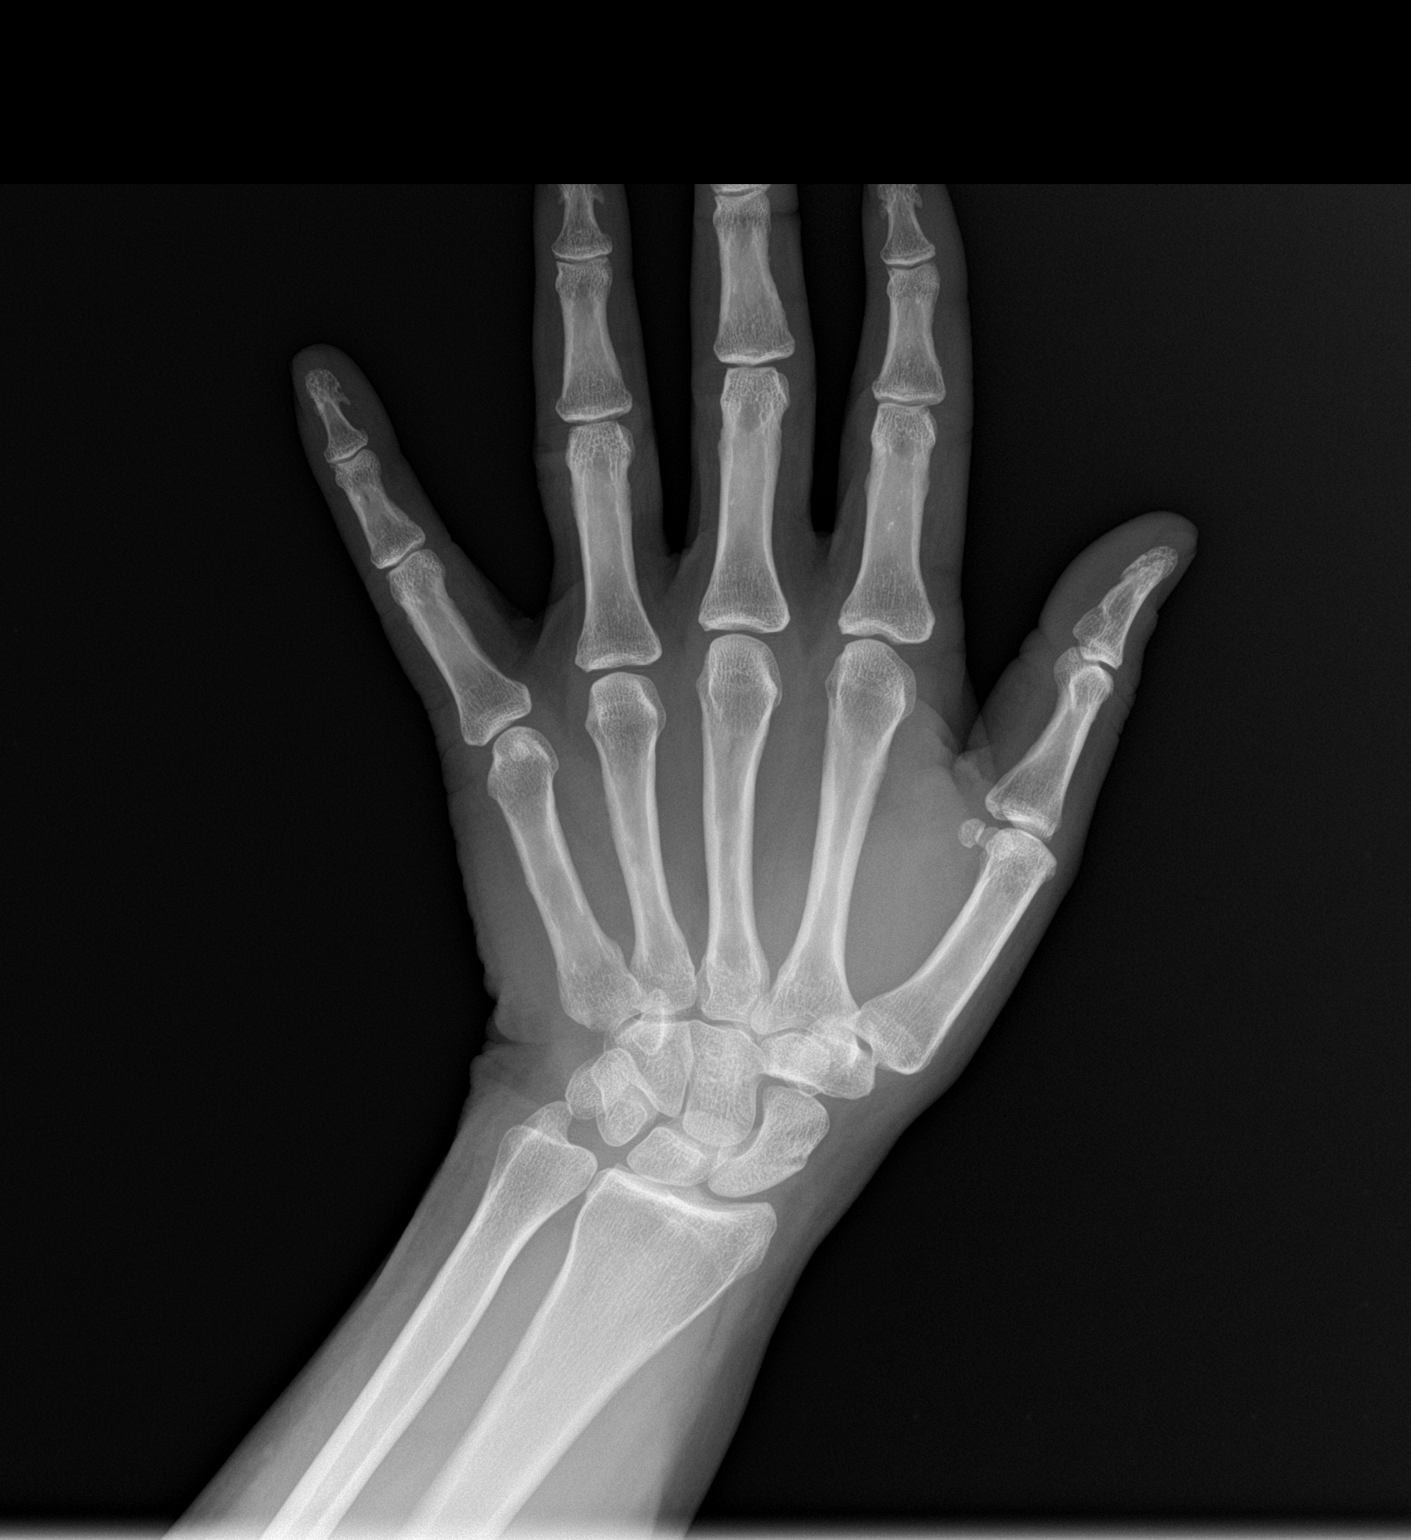

[hand obl]
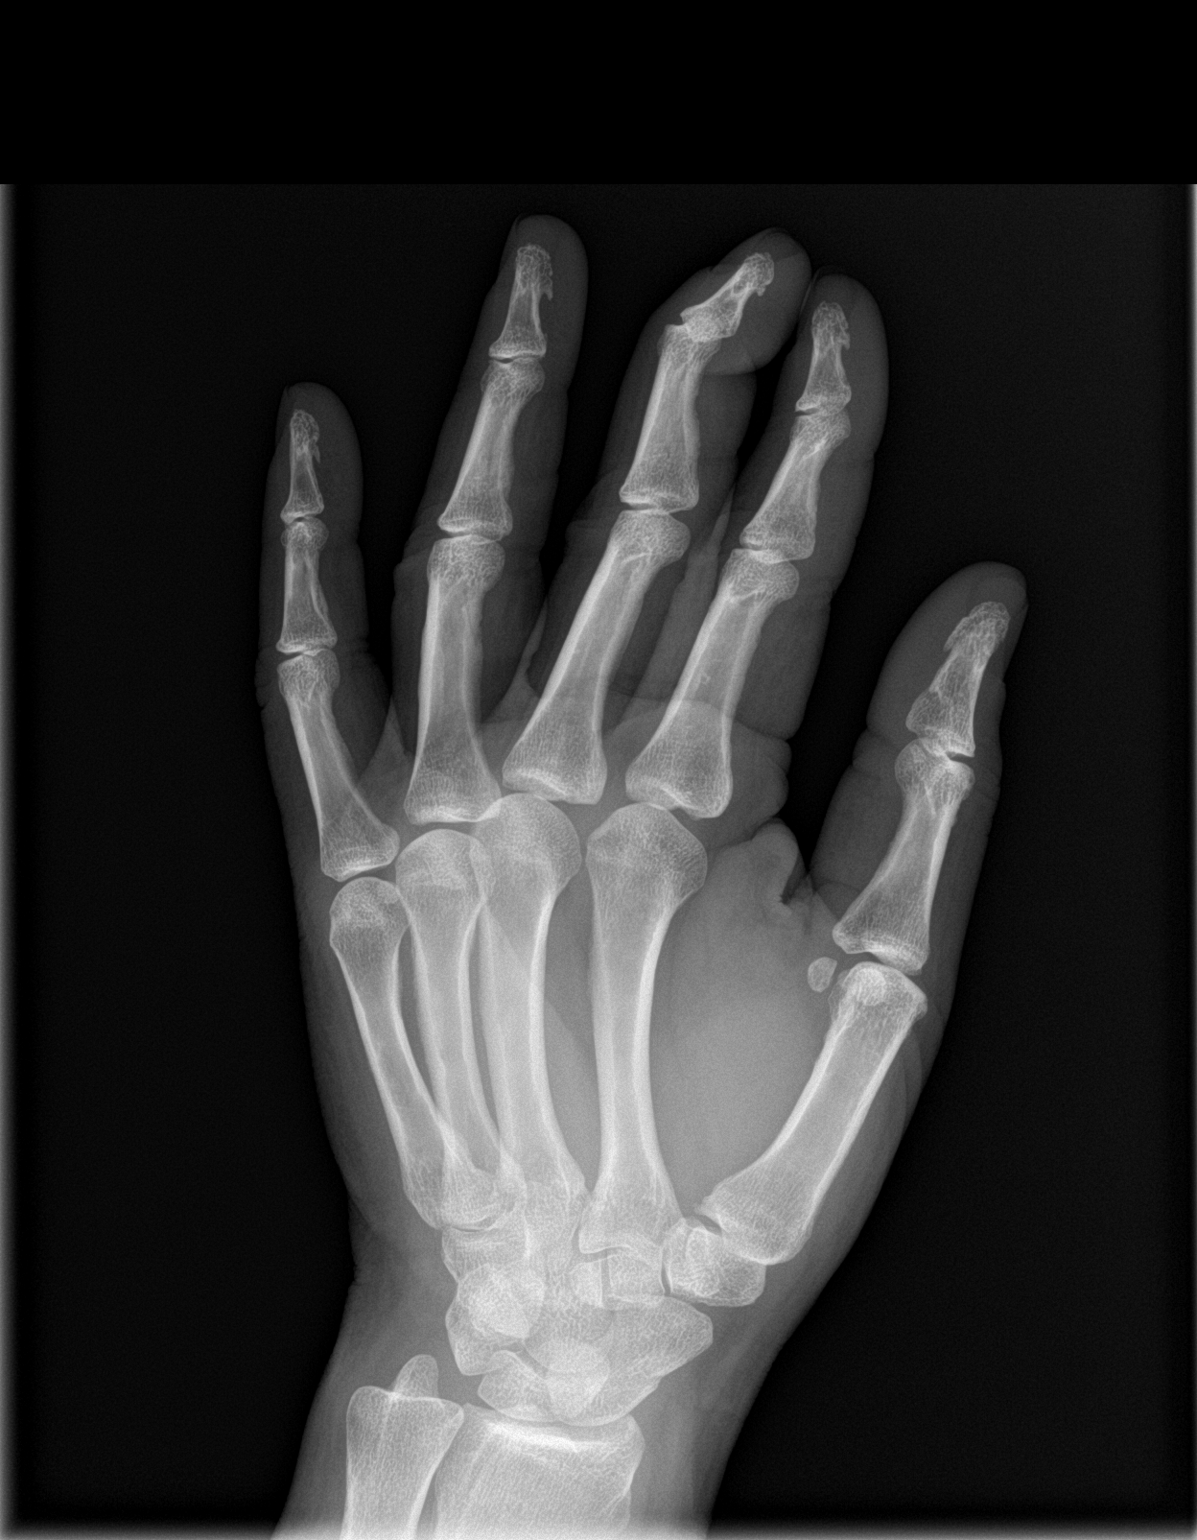

[hand lat]
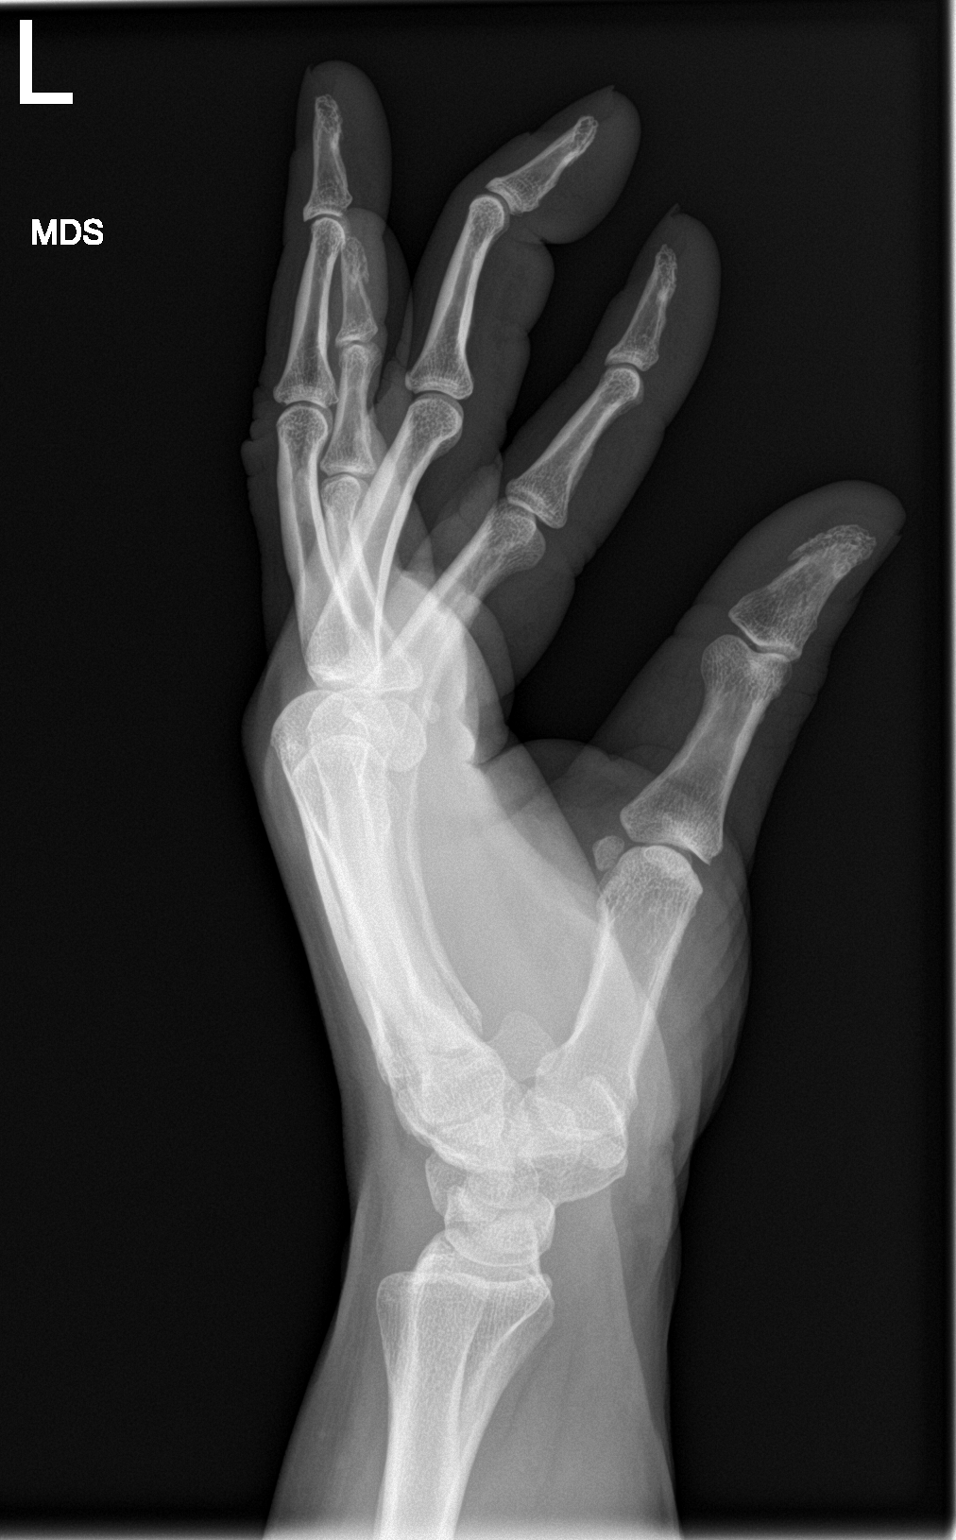

[3 of 3 positions shown; findings below may reference images not displayed]

FINDINGS: The bones of the left hand are subjectively adequately mineralized.
There is no acute fracture or dislocation. There is mild flexion at
the DIP joint of the third finger which may be acute or chronic.
These soft tissues appear mildly swollen as compared to the second
and fourth digits. The metacarpals and visualized portions of the
carpal bones are normal.
IMPRESSION: Mild soft tissue swelling of the left third digit with mild flexion
at the DIP joint. No acute fracture.

## 2019-03-01 ENCOUNTER — Ambulatory Visit (INDEPENDENT_AMBULATORY_CARE_PROVIDER_SITE_OTHER): Payer: Self-pay | Admitting: Family Medicine

## 2019-03-07 ENCOUNTER — Encounter (INDEPENDENT_AMBULATORY_CARE_PROVIDER_SITE_OTHER): Payer: Self-pay | Admitting: Family Medicine

## 2019-03-07 ENCOUNTER — Other Ambulatory Visit: Payer: Self-pay

## 2019-03-07 ENCOUNTER — Ambulatory Visit (INDEPENDENT_AMBULATORY_CARE_PROVIDER_SITE_OTHER): Payer: 59 | Admitting: Family Medicine

## 2019-03-07 VITALS — BP 108/72 | HR 75 | Temp 98.0°F | Ht 64.0 in | Wt 200.0 lb

## 2019-03-07 DIAGNOSIS — E669 Obesity, unspecified: Secondary | ICD-10-CM | POA: Diagnosis not present

## 2019-03-07 DIAGNOSIS — Z9189 Other specified personal risk factors, not elsewhere classified: Secondary | ICD-10-CM | POA: Diagnosis not present

## 2019-03-07 DIAGNOSIS — Z6834 Body mass index (BMI) 34.0-34.9, adult: Secondary | ICD-10-CM | POA: Diagnosis not present

## 2019-03-07 DIAGNOSIS — F3289 Other specified depressive episodes: Secondary | ICD-10-CM | POA: Diagnosis not present

## 2019-03-07 DIAGNOSIS — R0602 Shortness of breath: Secondary | ICD-10-CM

## 2019-03-07 DIAGNOSIS — Z0289 Encounter for other administrative examinations: Secondary | ICD-10-CM

## 2019-03-07 DIAGNOSIS — R5383 Other fatigue: Secondary | ICD-10-CM | POA: Diagnosis not present

## 2019-03-07 DIAGNOSIS — R739 Hyperglycemia, unspecified: Secondary | ICD-10-CM

## 2019-03-07 DIAGNOSIS — G4710 Hypersomnia, unspecified: Secondary | ICD-10-CM

## 2019-03-08 LAB — ANEMIA PANEL
Ferritin: 59 ng/mL (ref 15–150)
Folate, Hemolysate: 336 ng/mL
Folate, RBC: 949 ng/mL (ref >498)
Hematocrit: 35.4 % (ref 34.0–46.6)
Iron Saturation: 19 % (ref 15–55)
Iron: 66 ug/dL (ref 27–159)
Retic Ct Pct: 1.2 % (ref 0.6–2.6)
Total Iron Binding Capacity: 349 ug/dL (ref 250–450)
UIBC: 283 ug/dL (ref 131–425)
Vitamin B-12: 661 pg/mL (ref 232–1245)

## 2019-03-08 LAB — TSH: TSH: 1.65 u[IU]/mL (ref 0.450–4.500)

## 2019-03-08 LAB — T3: T3, Total: 137 ng/dL (ref 71–180)

## 2019-03-08 LAB — HEMOGLOBIN A1C
Est. average glucose Bld gHb Est-mCnc: 103 mg/dL
Hgb A1c MFr Bld: 5.2 % (ref 4.8–5.6)

## 2019-03-08 LAB — T4, FREE: Free T4: 1.32 ng/dL (ref 0.82–1.77)

## 2019-03-08 LAB — INSULIN, RANDOM: INSULIN: 4.8 u[IU]/mL (ref 2.6–24.9)

## 2019-03-08 NOTE — Progress Notes (Signed)
Office: [phone]  /  Fax: [phone]   Dear Dr. Dema Severin,   Thank you for referring Sarah Villa to our clinic. The following note includes my evaluation and treatment recommendations.  HPI:   Chief Complaint: OBESITY    Sarah Villa has been referred by Sarah Stains, MD for consultation regarding her obesity and obesity related comorbidities.    Sarah Villa (MR# [id number]) is a 53 y.o. female who presents on 03/07/2019 for obesity evaluation and treatment. Current BMI is Body mass index is 34.33 kg/m. Sarah Villa has been struggling with her weight for many years and has been unsuccessful in either losing weight, maintaining weight loss, or reaching her healthy weight goal.     Sarah Villa was previously on Contrave and did Keto. She lost weight but could not maintain it. 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     Sarah Villa states she is currently in the action stage of change and ready to dedicate time achieving and maintaining a healthier weight. [name] is interested in becoming our patient and working on intensive lifestyle modifications including (but not limited to) diet, exercise and weight loss.    Sarah Villa states her family eats meals together she thinks her family will eat healthier with  her she struggles with family and or coworkers weight loss sabotage her desired weight loss is 50 lbs she has been heavy most of  her life she started gaining weight over the last 5 years her heaviest weight ever was 220 lbs she has significant food cravings issues  she snacks frequently in the evenings she skips meals frequently she is frequently drinking liquids with calories she frequently makes poor food choices she frequently eats larger portions than normal  she struggles with emotional eating    Fatigue [name] feels her energy is lower than it should be. This has worsened with weight gain and has not worsened recently. She has a history of anemia and Vitamin B12 deficiency.  Sarah Villa admits to daytime somnolence and  denies waking up still tired. Patient is at risk for obstructive sleep apnea. Patent has a history of symptoms of daytime fatigue. Patient generally gets 6 hours of sleep per night, and states they generally have generally restful sleep. Snoring is not present. Apneic episodes are not present. Epworth Sleepiness Score is 8.  Dyspnea on exertion [name] notes increasing shortness of breath with exercising and seems to be worsening over time with weight gain. She notes getting out of breath sooner with activity than she used to. This has not gotten worse recently. [name] denies orthopnea.  Hypersomnia [name] has hypersomnia. Her epworth score is 8. She denies snoring, morning headaches or apneic events.  Hyperlipidemia [name] has hyperlipidemia and has been trying to improve her cholesterol levels with intensive lifestyle modification including a low saturated fat diet, exercise and weight loss. She denies any chest pain, claudication or myalgias.  Depression with Emotional Eating Behaviors Sarah Villa states she eats for comfort and overeats. She was previously on Contrave. [name] struggles with emotional eating and using food for comfort to the extent that it is negatively impacting her health. She often snacks when she is not hungry. Sarah Villa sometimes feels she is out of control and then feels guilty that she made poor food choices. She has been working on behavior modification techniques to help reduce her emotional eating and has been somewhat successful. She shows no sign of suicidal or homicidal ideations.  Depression Screen [name]'s Food and Mood (modified PHQ-9)  score was  Depression screen PHQ 2/9 03/07/2019  Decreased Interest 1  Down, Depressed, Hopeless 1  PHQ - 2 Score 2  Altered sleeping 1  Tired, decreased energy 2  Change in appetite 1  Feeling bad or failure about yourself  1  Trouble concentrating 1  Moving slowly or fidgety/restless 1    Suicidal thoughts 1  PHQ-9 Score 10  Difficult doing work/chores Somewhat difficult   At risk for diabetes Yazmyn is at higher than average risk for developing diabetes due to her obesity. She currently denies polyuria or polydipsia.  ASSESSMENT AND PLAN:  Other fatigue - Plan: EKG 12-Lead, T3, T4, free, TSH, Anemia panel  SOB (shortness of breath) on exertion - Plan: T3, T4, free, TSH, Anemia panel  Other depression, emotional eating   Hypersomnia - Plan: T3, T4, free, TSH, Anemia panel  Hyperglycemia - Plan: HgB A1c, Insulin, random  At risk for diabetes mellitus  Class 1 obesity with serious comorbidity and body mass index (BMI) of 34.0 to 34.9 in adult, unspecified obesity type  PLAN:  Fatigue [name] was informed that her fatigue may be related to obesity, depression or many other causes. Labs will be ordered, and in the meanwhile [name] has agreed to work on diet, exercise and weight loss to help with fatigue. Proper sleep hygiene was discussed including the need for 7-8 hours of quality sleep each night. A sleep study was not ordered based on symptoms and Epworth score.  Dyspnea on exertion [name]'s shortness of breath appears to be obesity related and exercise induced. She has agreed to work on weight loss and gradually increase exercise to treat her exercise induced shortness of breath. If [name] follows our instructions and loses weight without improvement of her shortness of breath, we will plan to refer to pulmonology. We will monitor this condition regularly. [name] agrees to this plan.  Hypersomnia We will check labs today and we will continue to monitor.  Hyperlipidemia [name] was informed of the American Heart Association Guidelines emphasizing intensive lifestyle modifications as the first line treatment for hyperlipidemia. We discussed many lifestyle modifications today in depth, and [name] will continue to work on decreasing saturated fats such as fatty red  meat, butter and many fried foods. She will also increase vegetables and lean protein in her diet and continue to work on exercise and weight loss efforts. We will check labs today and we will continue to monitor.  Depression with Emotional Eating Behaviors We discussed behavior modification techniques today to help Sarah Villa deal with her emotional eating and depression. We will continue to monitor and we will refer to Dr. Mallie Mussel, our Bariatric Psychologist for evaluation if needed.  Depression Screen Sarah Villa had a moderately positive depression screening. Depression is commonly associated with obesity and often results in emotional eating behaviors. We will monitor this closely and work on CBT to help improve the non-hunger eating patterns. Referral to Psychology may be required if no improvement is seen as she continues in our clinic.  Diabetes risk counseling Sarah Villa was given extended (15 minutes) diabetes prevention counseling today. She is 53 y.o. female and has risk factors for diabetes including obesity. We discussed intensive lifestyle modifications today with an emphasis on weight loss as well as increasing exercise and decreasing simple carbohydrates in her diet.  Obesity Sarah Villa is currently in the action stage of change and her goal is to continue with weight loss efforts. I recommend Sarah Villa begin the structured treatment plan as follows:  She has agreed  to follow the Category 1 plan Sarah Villa has been instructed to eventually work up to a goal of 150 minutes of combined cardio and strengthening exercise per week or as tolerated for weight loss and overall health benefits. We discussed the following Behavioral Modification Strategies today: increasing lean protein intake, decreasing simple carbohydrates, increasing vegetables, increase H20 intake, work on meal planning and easy cooking plans, holiday eating strategies , emotional eating strategies and decrease liquid calories   She was  informed of the importance of frequent follow up visits to maximize her success with intensive lifestyle modifications for her multiple health conditions. She was informed we would discuss her lab results at her next visit unless there is a critical issue that needs to be addressed sooner. Sarah Villa agreed to keep her next visit at the agreed upon time to discuss these results.  ALLERGIES: No Known Allergies  MEDICATIONS: Current Outpatient Medications on File Prior to Visit  Medication Sig Dispense Refill  . APPLE CIDER VINEGAR PO Take by mouth.    . Cholecalciferol (HM VITAMIN D3) 4000 UNITS CAPS daily.    Marland Kitchen loratadine-pseudoephedrine (CLARITIN-D 12-HOUR) 5-120 MG per tablet Take 1 tablet by mouth daily as needed for allergies. Reported on 08/07/2015    . methylPREDNISolone (MEDROL DOSEPAK) 4 MG TBPK tablet Take 6 pills on day one then decrease by 1 pill each day (Patient not taking: Reported on 08/07/2015) 21 tablet 0   No current facility-administered medications on file prior to visit.     PAST MEDICAL HISTORY: Past Medical History:  Diagnosis Date  . Anemia   . Anemia   . B12 deficiency   . Chlamydia   . Herpes   . History of ectopic pregnancy   . History of infertility, female   . Infertility, female   . Menorrhagia   . Obesity   . Yeast infection     PAST SURGICAL HISTORY: Past Surgical History:  Procedure Laterality Date  . DILATATION & CURETTAGE/HYSTEROSCOPY WITH MYOSURE N/A 02/20/2014   Procedure: DILATATION & CURETTAGE/HYSTEROSCOPY WITH MYOSURE;  Surgeon: Eldred Manges, MD;  Location: [facility];  Service: Gynecology;  Laterality: N/A;  . DILATION AND CURETTAGE OF UTERUS    . SALPINGECTOMY    . TUBAL LIGATION      SOCIAL HISTORY: Social History   Tobacco Use  . Smoking status: Never Smoker  . Smokeless tobacco: Never Used  Substance Use Topics  . Alcohol use: No    Alcohol/week: 1.0 - 2.0 standard drinks    Types: 1 - 2 Standard drinks or equivalent per week     Comment: occasional  . Drug use: No    FAMILY HISTORY: Family History  Problem Relation Age of Onset  . Hypertension Mother   . Heart disease Paternal Grandmother   . Hypertension Paternal Grandmother   . Depression Father   . Anxiety disorder Father   . Bipolar disorder Father   . Cancer Maternal Grandmother     ROS: Review of Systems  Constitutional: Positive for malaise/fatigue. Negative for weight loss.       + Trouble sleeping  HENT: Positive for sinus pain.        + Hay fever  Respiratory: Positive for shortness of breath (with exertion).   Cardiovascular: Negative for chest pain, orthopnea and claudication.       + Very cold feet or hands  Gastrointestinal: Positive for constipation.  Genitourinary: Negative for frequency.  Musculoskeletal: Negative for myalgias.  Neurological: Positive for headaches.  Endo/Heme/Allergies: Negative  for polydipsia.  Psychiatric/Behavioral: Positive for depression. Negative for suicidal ideas.    PHYSICAL EXAM: Blood pressure 108/72, pulse 75, temperature 98 F (36.7 C), temperature source Oral, height 5\' 4"  (1.626 m), weight 200 lb (90.7 kg), last menstrual period 02/14/2017, SpO2 99 %. Body mass index is 34.33 kg/m. Physical Exam Vitals signs reviewed.  Constitutional:      Appearance: Normal appearance. She is obese.  HENT:     Head: Normocephalic and atraumatic.     Nose: Nose normal.  Eyes:     General: No scleral icterus.    Extraocular Movements: Extraocular movements intact.  Neck:     Musculoskeletal: Normal range of motion and neck supple.     Comments: No thyromegaly present Cardiovascular:     Rate and Rhythm: Normal rate and regular rhythm.     Pulses: Normal pulses.     Heart sounds: Normal heart sounds.  Pulmonary:     Effort: Pulmonary effort is normal. No respiratory distress.     Breath sounds: Normal breath sounds.  Abdominal:     Palpations: Abdomen is soft.     Tenderness: There is no abdominal  tenderness.     Comments: + Obesity  Musculoskeletal: Normal range of motion.     Right lower leg: No edema.     Left lower leg: No edema.  Skin:    General: Skin is warm and dry.  Neurological:     Mental Status: She is alert and oriented to person, place, and time.     Coordination: Coordination normal.  Psychiatric:        Mood and Affect: Mood normal.        Behavior: Behavior normal.     RECENT LABS AND TESTS: BMET    Component Value Date/Time   NA 140 05/01/2009 1503   K 3.7 05/01/2009 1503   CL 109 05/01/2009 1503   GLUCOSE 94 05/01/2009 1503   BUN 5 (L) 05/01/2009 1503   CREATININE 0.8 05/01/2009 1503   Lab Results  Component Value Date   HGBA1C 5.2 03/07/2019   Lab Results  Component Value Date   INSULIN 4.8 03/07/2019   CBC    Component Value Date/Time   WBC 5.4 02/20/2014 0815   RBC 4.18 02/20/2014 0815   HGB 11.8 (L) 02/20/2014 0815   HCT 35.4 03/07/2019 0900   PLT 243 02/20/2014 0815   MCV 88.8 02/20/2014 0815   MCH 28.2 02/20/2014 0815   MCHC 31.8 02/20/2014 0815   RDW 13.9 02/20/2014 0815   LYMPHSABS 0.9 05/01/2009 1429   MONOABS 0.5 05/01/2009 1429   EOSABS 0.0 05/01/2009 1429   BASOSABS 0.0 05/01/2009 1429   Iron/TIBC/Ferritin/ %Sat    Component Value Date/Time   IRON 66 03/07/2019 0900   TIBC 349 03/07/2019 0900   FERRITIN 59 03/07/2019 0900   IRONPCTSAT 19 03/07/2019 0900   Lipid Panel  No results found for: CHOL, TRIG, HDL, CHOLHDL, VLDL, LDLCALC, LDLDIRECT Hepatic Function Panel  No results found for: PROT, ALBUMIN, AST, ALT, ALKPHOS, BILITOT, BILIDIR, IBILI    Component Value Date/Time   TSH 1.650 03/07/2019 0900    ECG  shows NSR with a rate of 77 BPM INDIRECT CALORIMETER done today shows a VO2 of 211 and a REE of 1469.  Her calculated basal metabolic rate is [id number] thus her basal metabolic rate is worse than expected.       OBESITY BEHAVIORAL INTERVENTION VISIT  Today's visit was # 1   Starting weight: 200  lbs Starting date: 03/07/2019 Today's weight : 200 lbs  Today's date: 03/07/2019 Total lbs lost to date: 0    ASK: We discussed the diagnosis of obesity with Sarah Villa today and Sarah Villa agreed to give [place] permission to discuss obesity behavioral modification therapy today.  ASSESS: Sarah Villa has the diagnosis of obesity and her BMI today is 34.31 Sarah Villa is in the action stage of change   ADVISE: Sarah Villa was educated on the multiple health risks of obesity as well as the benefit of weight loss to improve her health. She was advised of the need for long term treatment and the importance of lifestyle modifications to improve her current health and to decrease her risk of future health problems.  AGREE: Multiple dietary modification options and treatment options were discussed and  Sarah Villa agreed to follow the recommendations documented in the above note.  ARRANGE: Lusero was educated on the importance of frequent visits to treat obesity as outlined per CMS and USPSTF guidelines and agreed to schedule her next follow up appointment today.  Wilhemena Durie, am acting as transcriptionist for Briscoe Deutscher, DO  I have reviewed the above documentation for accuracy and completeness, and I agree with the above. Briscoe Deutscher, DO

## 2019-03-13 ENCOUNTER — Encounter (INDEPENDENT_AMBULATORY_CARE_PROVIDER_SITE_OTHER): Payer: Self-pay | Admitting: Family Medicine

## 2019-03-20 ENCOUNTER — Encounter (INDEPENDENT_AMBULATORY_CARE_PROVIDER_SITE_OTHER): Payer: Self-pay | Admitting: Family Medicine

## 2019-03-20 ENCOUNTER — Other Ambulatory Visit: Payer: Self-pay

## 2019-03-20 ENCOUNTER — Ambulatory Visit (INDEPENDENT_AMBULATORY_CARE_PROVIDER_SITE_OTHER): Payer: 59 | Admitting: Family Medicine

## 2019-03-20 VITALS — BP 108/71 | HR 84 | Temp 97.9°F | Ht 64.0 in | Wt 198.0 lb

## 2019-03-20 DIAGNOSIS — D508 Other iron deficiency anemias: Secondary | ICD-10-CM | POA: Diagnosis not present

## 2019-03-20 DIAGNOSIS — F3289 Other specified depressive episodes: Secondary | ICD-10-CM | POA: Diagnosis not present

## 2019-03-20 DIAGNOSIS — Z9189 Other specified personal risk factors, not elsewhere classified: Secondary | ICD-10-CM

## 2019-03-20 DIAGNOSIS — E559 Vitamin D deficiency, unspecified: Secondary | ICD-10-CM

## 2019-03-20 DIAGNOSIS — R5383 Other fatigue: Secondary | ICD-10-CM

## 2019-03-20 DIAGNOSIS — Z6834 Body mass index (BMI) 34.0-34.9, adult: Secondary | ICD-10-CM | POA: Diagnosis not present

## 2019-03-20 DIAGNOSIS — E669 Obesity, unspecified: Secondary | ICD-10-CM | POA: Diagnosis not present

## 2019-03-20 MED ORDER — BD PEN NEEDLE NANO 2ND GEN 32G X 4 MM MISC: 1.0000 | Freq: Two times a day (BID) | 0 refills | Status: DC

## 2019-03-20 MED ORDER — SAXENDA 18 MG/3ML ~~LOC~~ SOPN: 3.0000 mg | PEN_INJECTOR | Freq: Every day | SUBCUTANEOUS | 0 refills | Status: DC

## 2019-03-21 NOTE — Progress Notes (Signed)
Office: [phone]  /  Fax: [phone]   HPI:   Chief Complaint: OBESITY Sarah Villa is here to discuss her progress with her obesity treatment plan. She is on the Category 1 plan and is following her eating plan approximately 50-60 % of the time. She states she is walking on the treadmill for 1-2 miles 2 times per week. Laurey notes polyphagia. She likes the diet most of the time. She snacks on fruit and vegetables, but minimal protein.  Her weight is 198 lb (89.8 kg) today and has had a weight loss of 2 pounds over a period of 2 weeks since her last visit. She has lost 2 lbs since starting treatment with [place].  Vitamin D Deficiency Sarah Villa has a diagnosis of vitamin D deficiency. She is taking OTC Vit D 4,000 IU daily. She denies nausea, vomiting or muscle weakness.  Iron Deficiency Anemia Sarah Villa has a history of iron deficiency anemia, but this has resolved. She is not on iron supplementation.   Fatigue Sarah Villa notes fatigue. 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  Depression with Emotional Eating Behaviors Sarah Villa is struggling with emotional eating and using food for comfort to the extent that it is negatively impacting her health. She often snacks when she is not hungry. Sarah Villa sometimes feels she is out of control and then feels guilty that she made poor food choices. She has been working on behavior modification techniques to help reduce her emotional eating and has been somewhat successful. She shows no sign of suicidal or homicidal ideations.  Depression screen PHQ 2/9 03/07/2019  Decreased Interest 1  Down, Depressed, Hopeless 1  PHQ - 2 Score 2  Altered sleeping 1  Tired, decreased energy 2  Change in appetite 1  Feeling bad or failure about yourself  1  Trouble concentrating 1  Moving slowly or fidgety/restless 1  Suicidal thoughts 1  PHQ-9 Score 10  Difficult doing work/chores Somewhat difficult    ASSESSMENT AND PLAN:  Vitamin D  deficiency  Other fatigue  Other iron deficiency anemia  Other depression, emotional eating  At risk for osteoporosis  Class 1 obesity with serious comorbidity and body mass index (BMI) of 34.0 to 34.9 in adult, unspecified obesity type - Plan: Liraglutide -Weight Management (SAXENDA) 18 MG/3ML SOPN, Insulin Pen Needle (BD PEN NEEDLE NANO 2ND GEN) 32G X 4 MM MISC  PLAN:  Vitamin D Deficiency Low vitamin D level contributes to fatigue and are associated with obesity, breast, and colon cancer. Sarah Villa agrees to continue taking OTC Vit D 4,000 IU daily and will follow up for routine testing of vitamin D, at least 2-3 times per year to avoid over-replacement. Sarah Villa agrees to follow up with our clinic in 2 weeks.  At risk for osteopenia and osteoporosis Sarah Villa was given extended (15 minutes) osteoporosis prevention counseling today. Sarah Villa is at risk for osteopenia and osteoporsis due to her vitamin D deficiency. She was encouraged to take her vitamin D and follow her higher calcium diet and increase strengthening exercise to help strengthen her bones and decrease her risk of osteopenia and osteoporosis.  Iron Deficiency Anemia The diagnosis of Iron deficiency anemia was discussed with Sarah Villa. She was given suggestions of iron rich foods and iron supplement was not prescribed. We will continue to monitor.  Fatigue Sarah Villa was informed fatigue may be related to obesity, depression or many other causes. Sarah Villa has agreed to work on diet, exercise and weight loss. We will continue to monitor.  Emotional Eating Behaviors (other depression) Behavior modification techniques were discussed today to help Sarah Villa deal with her emotional/non-hunger eating behaviors. We will continue to follow and monitor her progress.  Obesity Sarah Villa is currently in the action stage of change. As such, her goal is to continue with weight loss efforts She has agreed to follow the Category 1 plan Sarah Villa has been  instructed to work up to a goal of 150 minutes of combined cardio and strengthening exercise per week or increase walking to 3 times per week for weight loss and overall health benefits. We discussed the following Behavioral Modification Strategies today: increasing lean protein intake, decreasing simple carbohydrates, increasing vegetables, increase H20 intake, work on meal planning and easy cooking plans, holiday eating strategies  and decrease liquid calories We discussed various medication options to help Sarah Villa with her weight loss efforts and we both agreed to start Saxenda 0.3 mg SubQ daily #5 pens with no refills, and nano needles #100 with no refills.   Sarah Villa has agreed to follow up with our clinic in 2 weeks. She was informed of the importance of frequent follow up visits to maximize her success with intensive lifestyle modifications for her multiple health conditions.  ALLERGIES: No Known Allergies  MEDICATIONS: Current Outpatient Medications on File Prior to Visit  Medication Sig Dispense Refill  . APPLE CIDER VINEGAR PO Take by mouth.    . Cholecalciferol (HM VITAMIN D3) 4000 UNITS CAPS daily.    Marland Kitchen loratadine-pseudoephedrine (CLARITIN-D 12-HOUR) 5-120 MG per tablet Take 1 tablet by mouth daily as needed for allergies. Reported on 08/07/2015     No current facility-administered medications on file prior to visit.     PAST MEDICAL HISTORY: Past Medical History:  Diagnosis Date  . Anemia   . Anemia   . B12 deficiency   . Chlamydia   . Herpes   . History of ectopic pregnancy   . History of infertility, female   . Infertility, female   . Menorrhagia   . Obesity   . Yeast infection     PAST SURGICAL HISTORY: Past Surgical History:  Procedure Laterality Date  . DILATATION & CURETTAGE/HYSTEROSCOPY WITH MYOSURE N/A 02/20/2014   Procedure: DILATATION & CURETTAGE/HYSTEROSCOPY WITH MYOSURE;  Surgeon: Eldred Manges, MD;  Location: [facility];  Service: Gynecology;  Laterality:  N/A;  . DILATION AND CURETTAGE OF UTERUS    . SALPINGECTOMY    . TUBAL LIGATION      SOCIAL HISTORY: Social History   Tobacco Use  . Smoking status: Never Smoker  . Smokeless tobacco: Never Used  Substance Use Topics  . Alcohol use: No    Alcohol/week: 1.0 - 2.0 standard drinks    Types: 1 - 2 Standard drinks or equivalent per week    Comment: occasional  . Drug use: No    FAMILY HISTORY: Family History  Problem Relation Age of Onset  . Hypertension Mother   . Heart disease Paternal Grandmother   . Hypertension Paternal Grandmother   . Depression Father   . Anxiety disorder Father   . Bipolar disorder Father   . Cancer Maternal Grandmother     ROS: Review of Systems  Constitutional: Positive for malaise/fatigue and weight loss.  Gastrointestinal: Negative for nausea and vomiting.  Musculoskeletal:       Negative muscle weakness  Endo/Heme/Allergies:       Positive polyphagia  Psychiatric/Behavioral: Positive for depression. Negative for suicidal ideas.    PHYSICAL EXAM: Blood pressure 108/71, pulse 84, temperature 97.9  F (36.6 C), temperature source Oral, height 5\' 4"  (1.626 m), weight 198 lb (89.8 kg), last menstrual period 02/14/2017, SpO2 99 %. Body mass index is 33.99 kg/m. Physical Exam Vitals signs reviewed.  Constitutional:      Appearance: Normal appearance. She is obese.  Cardiovascular:     Rate and Rhythm: Normal rate.     Pulses: Normal pulses.  Pulmonary:     Effort: Pulmonary effort is normal.     Breath sounds: Normal breath sounds.  Musculoskeletal: Normal range of motion.  Skin:    General: Skin is warm and dry.  Neurological:     Mental Status: She is alert and oriented to person, place, and time.  Psychiatric:        Mood and Affect: Mood normal.        Behavior: Behavior normal.     RECENT LABS AND TESTS: BMET    Component Value Date/Time   NA 140 05/01/2009 1503   K 3.7 05/01/2009 1503   CL 109 05/01/2009 1503   GLUCOSE  94 05/01/2009 1503   BUN 5 (L) 05/01/2009 1503   CREATININE 0.8 05/01/2009 1503   Lab Results  Component Value Date   HGBA1C 5.2 03/07/2019   Lab Results  Component Value Date   INSULIN 4.8 03/07/2019   CBC    Component Value Date/Time   WBC 5.4 02/20/2014 0815   RBC 4.18 02/20/2014 0815   HGB 11.8 (L) 02/20/2014 0815   HCT 35.4 03/07/2019 0900   PLT 243 02/20/2014 0815   MCV 88.8 02/20/2014 0815   MCH 28.2 02/20/2014 0815   MCHC 31.8 02/20/2014 0815   RDW 13.9 02/20/2014 0815   LYMPHSABS 0.9 05/01/2009 1429   MONOABS 0.5 05/01/2009 1429   EOSABS 0.0 05/01/2009 1429   BASOSABS 0.0 05/01/2009 1429   Iron/TIBC/Ferritin/ %Sat    Component Value Date/Time   IRON 66 03/07/2019 0900   TIBC 349 03/07/2019 0900   FERRITIN 59 03/07/2019 0900   IRONPCTSAT 19 03/07/2019 0900   Lipid Panel  No results found for: CHOL, TRIG, HDL, CHOLHDL, VLDL, LDLCALC, LDLDIRECT Hepatic Function Panel  No results found for: PROT, ALBUMIN, AST, ALT, ALKPHOS, BILITOT, BILIDIR, IBILI    Component Value Date/Time   TSH 1.650 03/07/2019 0900      OBESITY BEHAVIORAL INTERVENTION VISIT  Today's visit was # 2   Starting weight: 200 lbs Starting date: 03/07/2019 Today's weight : 198 lbs Today's date: 03/20/2019 Total lbs lost to date: 2    ASK: We discussed the diagnosis of obesity with Sarah Villa today and Sarah Villa agreed to give [place] permission to discuss obesity behavioral modification therapy today.  ASSESS: Sarah Villa has the diagnosis of obesity and her BMI today is 33.97 Aniylah is in the action stage of change   ADVISE: Dasja was educated on the multiple health risks of obesity as well as the benefit of weight loss to improve her health. She was advised of the need for long term treatment and the importance of lifestyle modifications to improve her current health and to decrease her risk of future health problems.  AGREE: Multiple dietary modification options and treatment options  were discussed and  Sarah Villa agreed to follow the recommendations documented in the above note.  ARRANGE: Sarah Villa was educated on the importance of frequent visits to treat obesity as outlined per CMS and USPSTF guidelines and agreed to schedule her next follow up appointment today.  Sarah Villa, am acting as transcriptionist for [employer],  DO  I have reviewed the above documentation for accuracy and completeness, and I agree with the above. Briscoe Deutscher, DO

## 2019-03-22 ENCOUNTER — Encounter (INDEPENDENT_AMBULATORY_CARE_PROVIDER_SITE_OTHER): Payer: Self-pay | Admitting: Family Medicine

## 2019-03-28 ENCOUNTER — Encounter (INDEPENDENT_AMBULATORY_CARE_PROVIDER_SITE_OTHER): Payer: Self-pay

## 2019-04-04 ENCOUNTER — Ambulatory Visit (INDEPENDENT_AMBULATORY_CARE_PROVIDER_SITE_OTHER): Payer: 59 | Admitting: Family Medicine

## 2019-04-05 ENCOUNTER — Encounter (INDEPENDENT_AMBULATORY_CARE_PROVIDER_SITE_OTHER): Payer: Self-pay | Admitting: Bariatrics

## 2019-04-05 ENCOUNTER — Ambulatory Visit (INDEPENDENT_AMBULATORY_CARE_PROVIDER_SITE_OTHER): Payer: 59 | Admitting: Bariatrics

## 2019-04-05 ENCOUNTER — Other Ambulatory Visit: Payer: Self-pay

## 2019-04-05 VITALS — BP 102/66 | HR 87 | Temp 98.2°F | Ht 64.0 in | Wt 196.0 lb

## 2019-04-05 DIAGNOSIS — Z6833 Body mass index (BMI) 33.0-33.9, adult: Secondary | ICD-10-CM

## 2019-04-05 DIAGNOSIS — F3289 Other specified depressive episodes: Secondary | ICD-10-CM | POA: Diagnosis not present

## 2019-04-05 DIAGNOSIS — E669 Obesity, unspecified: Secondary | ICD-10-CM | POA: Diagnosis not present

## 2019-04-05 NOTE — Progress Notes (Signed)
Office: [phone]  /  Fax: [phone]   HPI:  Chief Complaint: OBESITY Sarah Villa is here to discuss her progress with her obesity treatment plan. She is on the Category 1 plan and states she is following her eating plan approximately 50% of the time. She states she is doing cardio 30 minutes 4 times per week.  Sarah Villa is down 2 lbs. She is taking [place] and doing well.  Today's visit was #3 Starting weight: 200 lbs Starting date: 03/07/2019 Today's weight: 196 lbs Today's date: 04/05/2019 Total lbs lost to date: 4  Total lbs lost since last in-office visit: 2   Other Depression Sarah Villa reports occasional stress eating. She is on no medications. No suicidal or homicidal ideations.  ASSESSMENT AND PLAN:  Other depression, emotional eating  Class 1 obesity with serious comorbidity and body mass index (BMI) of 33.0 to 33.9 in adult, unspecified obesity type  PLAN:  Other Depression We discussed CBT techniques. She will follow-up as directed.  TIME SPENT: 22 minutes.  Obesity Sarah Villa is currently in the action stage of change. As such, her goal is to continue with weight loss efforts She has agreed to follow the Category 1 plan. Sarah Villa will continue Saxenda 1.2 mg. She can tell that her appetite is down. She will increase her water intake, decrease sodas, and will continue to weigh her meat. Sarah Villa has been instructed to continue exercising (increased energy) for weight loss and overall health benefits. We discussed the following Behavioral Modification Strategies today: increasing lean protein intake, decreasing simple carbohydrates, increasing vegetables, increase H20 intake, decrease eating out, no skipping meals, work on meal planning and easy cooking plans, and keeping healthy foods in the home.  Sarah Villa has agreed to follow-up with our clinic in 2-3 weeks. She was informed of the importance of frequent follow-up visits to maximize her success with intensive lifestyle  modifications for her multiple health conditions.  ALLERGIES: No Known Allergies  MEDICATIONS: Current Outpatient Medications on File Prior to Visit  Medication Sig Dispense Refill  . Cholecalciferol (HM VITAMIN D3) 4000 UNITS CAPS daily.    . Insulin Pen Needle (BD PEN NEEDLE NANO 2ND GEN) 32G X 4 MM MISC 1 Package by Does not apply route 2 (two) times daily. 100 each 0  . Liraglutide -Weight Management (SAXENDA) 18 MG/3ML SOPN Inject 3 mg into the skin daily. 5 pen 0  . loratadine-pseudoephedrine (CLARITIN-D 12-HOUR) 5-120 MG per tablet Take 1 tablet by mouth daily as needed for allergies. Reported on 08/07/2015     No current facility-administered medications on file prior to visit.    PAST MEDICAL HISTORY: Past Medical History:  Diagnosis Date  . Anemia   . Anemia   . B12 deficiency   . Chlamydia   . Herpes   . History of ectopic pregnancy   . History of infertility, female   . Infertility, female   . Menorrhagia   . Obesity   . Yeast infection     PAST SURGICAL HISTORY: Past Surgical History:  Procedure Laterality Date  . DILATATION & CURETTAGE/HYSTEROSCOPY WITH MYOSURE N/A 02/20/2014   Procedure: DILATATION & CURETTAGE/HYSTEROSCOPY WITH MYOSURE;  Surgeon: Eldred Manges, MD;  Location: [facility];  Service: Gynecology;  Laterality: N/A;  . DILATION AND CURETTAGE OF UTERUS    . SALPINGECTOMY    . TUBAL LIGATION      SOCIAL HISTORY: Social History   Tobacco Use  . Smoking status: Never Smoker  . Smokeless tobacco: Never Used  Substance Use  Topics  . Alcohol use: No    Alcohol/week: 1.0 - 2.0 standard drinks    Types: 1 - 2 Standard drinks or equivalent per week    Comment: occasional  . Drug use: No    FAMILY HISTORY: Family History  Problem Relation Age of Onset  . Hypertension Mother   . Heart disease Paternal Grandmother   . Hypertension Paternal Grandmother   . Depression Father   . Anxiety disorder Father   . Bipolar disorder Father   . Cancer  Maternal Grandmother    ROS: Review of Systems  Constitutional: Positive for weight loss.  Psychiatric/Behavioral: Positive for depression. Negative for suicidal ideas.       Negative for homicidal ideas.   PHYSICAL EXAM: Blood pressure 102/66, pulse 87, temperature 98.2 F (36.8 C), height 5\' 4"  (1.626 m), weight 196 lb (88.9 kg), last menstrual period 02/14/2017, SpO2 97 %. Body mass index is 33.64 kg/m. Physical Exam Vitals reviewed.  Constitutional:      Appearance: Normal appearance. She is obese.  Cardiovascular:     Rate and Rhythm: Normal rate.     Pulses: Normal pulses.  Pulmonary:     Effort: Pulmonary effort is normal.     Breath sounds: Normal breath sounds.  Musculoskeletal:        General: Normal range of motion.  Skin:    General: Skin is warm and dry.  Neurological:     Mental Status: She is alert and oriented to person, place, and time.  Psychiatric:        Behavior: Behavior normal.   RECENT LABS AND TESTS: BMET    Component Value Date/Time   NA 140 05/01/2009 1503   K 3.7 05/01/2009 1503   CL 109 05/01/2009 1503   GLUCOSE 94 05/01/2009 1503   BUN 5 (L) 05/01/2009 1503   CREATININE 0.8 05/01/2009 1503   Lab Results  Component Value Date   HGBA1C 5.2 03/07/2019   Lab Results  Component Value Date   INSULIN 4.8 03/07/2019   CBC    Component Value Date/Time   WBC 5.4 02/20/2014 0815   RBC 4.18 02/20/2014 0815   HGB 11.8 (L) 02/20/2014 0815   HCT 35.4 03/07/2019 0900   PLT 243 02/20/2014 0815   MCV 88.8 02/20/2014 0815   MCH 28.2 02/20/2014 0815   MCHC 31.8 02/20/2014 0815   RDW 13.9 02/20/2014 0815   LYMPHSABS 0.9 05/01/2009 1429   MONOABS 0.5 05/01/2009 1429   EOSABS 0.0 05/01/2009 1429   BASOSABS 0.0 05/01/2009 1429   Iron/TIBC/Ferritin/ %Sat    Component Value Date/Time   IRON 66 03/07/2019 0900   TIBC 349 03/07/2019 0900   FERRITIN 59 03/07/2019 0900   IRONPCTSAT 19 03/07/2019 0900   Lipid Panel  No results found for:  CHOL, TRIG, HDL, CHOLHDL, VLDL, LDLCALC, LDLDIRECT Hepatic Function Panel  No results found for: PROT, ALBUMIN, AST, ALT, ALKPHOS, BILITOT, BILIDIR, IBILI    Component Value Date/Time   TSH 1.650 03/07/2019 0900    I, Michaelene Song, am acting as Location manager for [employer], DO  I have reviewed the above documentation for accuracy and completeness, and I agree with the above. Jearld Lesch, DO

## 2019-04-24 ENCOUNTER — Encounter (INDEPENDENT_AMBULATORY_CARE_PROVIDER_SITE_OTHER): Payer: Self-pay

## 2019-04-25 ENCOUNTER — Ambulatory Visit (INDEPENDENT_AMBULATORY_CARE_PROVIDER_SITE_OTHER): Payer: 59 | Admitting: Family Medicine

## 2019-05-08 ENCOUNTER — Ambulatory Visit (INDEPENDENT_AMBULATORY_CARE_PROVIDER_SITE_OTHER): Payer: 59 | Admitting: Family Medicine

## 2019-05-11 ENCOUNTER — Ambulatory Visit (INDEPENDENT_AMBULATORY_CARE_PROVIDER_SITE_OTHER): Payer: 59 | Admitting: Family Medicine

## 2019-05-11 ENCOUNTER — Other Ambulatory Visit: Payer: Self-pay

## 2019-05-11 ENCOUNTER — Encounter (INDEPENDENT_AMBULATORY_CARE_PROVIDER_SITE_OTHER): Payer: Self-pay | Admitting: Family Medicine

## 2019-05-11 VITALS — BP 109/74 | HR 91 | Temp 98.5°F | Ht 64.0 in | Wt 196.0 lb

## 2019-05-11 DIAGNOSIS — E669 Obesity, unspecified: Secondary | ICD-10-CM | POA: Insufficient documentation

## 2019-05-11 DIAGNOSIS — F3289 Other specified depressive episodes: Secondary | ICD-10-CM | POA: Diagnosis not present

## 2019-05-11 DIAGNOSIS — E559 Vitamin D deficiency, unspecified: Secondary | ICD-10-CM | POA: Diagnosis not present

## 2019-05-11 DIAGNOSIS — Z6833 Body mass index (BMI) 33.0-33.9, adult: Secondary | ICD-10-CM | POA: Insufficient documentation

## 2019-05-11 DIAGNOSIS — F329 Major depressive disorder, single episode, unspecified: Secondary | ICD-10-CM | POA: Insufficient documentation

## 2019-05-11 DIAGNOSIS — F32A Depression, unspecified: Secondary | ICD-10-CM | POA: Insufficient documentation

## 2019-05-11 NOTE — Progress Notes (Signed)
Chief Complaint:   OBESITY Sarah Villa is here to discuss her progress with her obesity treatment plan along with follow-up of her obesity related diagnoses. Sarah Villa is on the Category 1 Plan and states she is following her eating plan approximately 50% of the time. Sarah Villa states she is exercising on the treadmill 35 minutes 3-4 times per week.  Today's visit was #: 4 Starting weight: 200 lbs Starting date: 03/07/2019 Today's weight: 196 lbs Today's date: 05/11/2019 Total lbs lost to date: 4 Total lbs lost since last in-office visit: 0  Interim History: Sarah Villa is on Saxenda 1.8 mg daily. She reports some constipation. She reports getting off plan on weekends. She reports she does not always get in all of the prescribed protein.  Subjective:   Vitamin D deficiency. Sarah Villa is on OTC Vitamin D 4,000 IU. Vitamin D level has not been checked recently.  Other depression, with emotional eating. Sarah Villa is struggling with emotional eating and using food for comfort to the extent that it is negatively impacting her health.  She reports that Sarah Villa is helping with stress eating as well as overall hunger.  Assessment/Plan:   Vitamin D deficiency. Low Vitamin D level contributes to fatigue and are associated with obesity, breast, and colon cancer. She will have routine testing of Vitamin D in 1 month.  Other depression, with emotional eating. Behavior modification techniques were discussed today to help Sarah Villa deal with her emotional/non-hunger eating behaviors.  Orders and follow up as documented in patient record. We discussed strategies for emotional eating.  Class 1 obesity with serious comorbidity and body mass index (BMI) of 33.0 to 33.9 in adult, unspecified obesity type.  Sarah Villa is currently in the action stage of change. As such, her goal is to continue with weight loss efforts. She has agreed to the Category 1 Plan and journal 1100-1200 calories + 80 grams of protein daily.  She  will continue Saxenda 1.8 mg daily.  Exercise goals: Sarah Villa will continue her current exercise regimen and will add resistance 2 times per week.  Behavioral modification strategies: increasing lean protein intake, emotional eating strategies, planning for success and keeping a strict food journal.  Sarah Villa has agreed to follow-up with our clinic in 2 weeks. She was informed of the importance of frequent follow-up visits to maximize her success with intensive lifestyle modifications.  Objective:   Blood pressure 109/74, pulse 91, temperature 98.5 F (36.9 C), temperature source Oral, height 5\' 4"  (1.626 m), weight 196 lb (88.9 kg), last menstrual period 02/14/2017, SpO2 100 %. Body mass index is 33.64 kg/m.  General: Cooperative, alert, well developed, in no acute distress. HEENT: Conjunctivae and lids unremarkable. Cardiovascular: Regular rhythm.  Lungs: Normal work of breathing. Neurologic: No focal deficits.   Lab Results  Component Value Date   CREATININE 0.8 05/01/2009   BUN 5 (L) 05/01/2009   NA 140 05/01/2009   K 3.7 05/01/2009   CL 109 05/01/2009   No results found for: ALT, AST, GGT, ALKPHOS, BILITOT Lab Results  Component Value Date   HGBA1C 5.2 03/07/2019   Lab Results  Component Value Date   INSULIN 4.8 03/07/2019   Lab Results  Component Value Date   TSH 1.650 03/07/2019   No results found for: CHOL, HDL, LDLCALC, LDLDIRECT, TRIG, CHOLHDL Lab Results  Component Value Date   WBC 5.4 02/20/2014   HGB 11.8 (L) 02/20/2014   HCT 35.4 03/07/2019   MCV 88.8 02/20/2014   PLT 243 02/20/2014  Lab Results  Component Value Date   IRON 66 03/07/2019   TIBC 349 03/07/2019   FERRITIN 59 03/07/2019   Attestation Statements:   Reviewed by clinician on day of visit: allergies, medications, problem list, medical history, surgical history, family history, social history, and previous encounter notes.  IMichaelene Villa, am acting as Location manager for [employer], FNP   I have reviewed the above documentation for accuracy and completeness, and I agree with the above. - Georgianne Fick, FNP

## 2019-05-31 ENCOUNTER — Encounter (INDEPENDENT_AMBULATORY_CARE_PROVIDER_SITE_OTHER): Payer: Self-pay | Admitting: Family Medicine

## 2019-05-31 ENCOUNTER — Ambulatory Visit (INDEPENDENT_AMBULATORY_CARE_PROVIDER_SITE_OTHER): Payer: 59 | Admitting: Family Medicine

## 2019-05-31 ENCOUNTER — Encounter (INDEPENDENT_AMBULATORY_CARE_PROVIDER_SITE_OTHER): Payer: Self-pay

## 2019-05-31 ENCOUNTER — Other Ambulatory Visit: Payer: Self-pay

## 2019-05-31 VITALS — BP 117/83 | HR 94 | Temp 98.3°F | Ht 64.0 in | Wt 199.0 lb

## 2019-05-31 DIAGNOSIS — Z6834 Body mass index (BMI) 34.0-34.9, adult: Secondary | ICD-10-CM | POA: Diagnosis not present

## 2019-05-31 DIAGNOSIS — E669 Obesity, unspecified: Secondary | ICD-10-CM

## 2019-05-31 DIAGNOSIS — R739 Hyperglycemia, unspecified: Secondary | ICD-10-CM | POA: Diagnosis not present

## 2019-05-31 DIAGNOSIS — E559 Vitamin D deficiency, unspecified: Secondary | ICD-10-CM | POA: Diagnosis not present

## 2019-05-31 DIAGNOSIS — Z9189 Other specified personal risk factors, not elsewhere classified: Secondary | ICD-10-CM

## 2019-05-31 MED ORDER — SAXENDA 18 MG/3ML ~~LOC~~ SOPN: 3.0000 mg | PEN_INJECTOR | Freq: Every day | SUBCUTANEOUS | 0 refills | Status: DC

## 2019-06-01 ENCOUNTER — Ambulatory Visit (INDEPENDENT_AMBULATORY_CARE_PROVIDER_SITE_OTHER): Payer: 59 | Admitting: Bariatrics

## 2019-06-05 NOTE — Progress Notes (Signed)
Chief Complaint:   OBESITY Sarah Villa is here to discuss her progress with her obesity treatment plan along with follow-up of her obesity related diagnoses. Sarah Villa is on the Category 1 Plan and keeping a food journal of 1100 to 1200 calories and 80 grams of protein daily plan and states she is following her eating plan approximately 50% of the time. Sarah Villa states she is walking on the treadmill for 30 to 45 minutes 3 to 4 times per week.  Today's visit was #: 5 Starting weight: 200 lbs Starting date: 03/07/19 Today's weight: 199 lbs Today's date: 05/31/2019 Total lbs lost to date: 1 Total lbs lost since last in-office visit: 0  Interim History: Sarah Villa feels she may go too long without eating or she does some indulgent eating just to get food in. She is using [employer], and she is getting approximately 1200 to 1300 calories, but she voices she thinks she isn't getting all of the protein in.  Subjective:   Vitamin D deficiency Sarah Villa is on 4,000 IU vit D daily. She admits fatigue and denies nausea, vomiting or muscle weakness.  Hyperglycemia Sarah Villa has a history of hyperglycemia diagnosis, and she is not on medications. Her last insulin level was within normal limits.   At risk for osteoporosis Sarah Villa is at higher risk of osteopenia and osteoporosis due to Vitamin D deficiency.   Assessment/Plan:   Vitamin D deficiency Low Vitamin D level contributes to fatigue and are associated with obesity, breast, and colon cancer. Sarah Villa will continue to take OTC Vitamin D @4 ,000 IU daily (not on prescription), and she will follow-up for routine testing of Vitamin D, at least 2-3 times per year to avoid over-replacement.  Hyperglycemia We will repeat labs in the next few appointments. Sarah Villa will continue on a lower simple carbohydrate diet and she will continue to work on weight loss efforts.  At risk for osteoporosis Sarah Villa was given approximately 15 minutes of osteoporosis  prevention counseling today. Sarah Villa is at risk for osteopenia and osteoporosis due to her Vitamin D deficiency. She was encouraged to take her Vitamin D and follow her higher calcium diet and increase strengthening exercise to help strengthen her bones and decrease her risk of osteopenia and osteoporosis.  Repetitive spaced learning was employed today to elicit superior memory formation and behavioral change.  Class 1 obesity with serious comorbidity and body mass index (BMI) of 34.0 to 34.9 in adult, unspecified obesity type  Sarah Villa is currently in the action stage of change. As such, her goal is to continue with weight loss efforts. She has agreed to keeping a food journal and adhering to recommended goals of 1100 to 1200 calories and 80+ grams of protein daily.   Behavioral modification strategies: increasing lean protein intake, increasing vegetables, meal planning and cooking strategies, keeping healthy foods in the home and planning for success.  Sarah Villa agreed to continue Liraglutide -Weight Management (SAXENDA) 3 mg sub Q daily #5 pens with no refills.  Sarah Villa has agreed to follow-up with our clinic in 2 weeks. She was informed of the importance of frequent follow-up visits to maximize her success with intensive lifestyle modifications for her multiple health conditions.   Objective:   Blood pressure 117/83, pulse 94, temperature 98.3 F (36.8 C), temperature source Oral, height 5\' 4"  (1.626 m), weight 199 lb (90.3 kg), last menstrual period 02/14/2017, SpO2 98 %. Body mass index is 34.16 kg/m.  General: Cooperative, alert, well developed, in no acute distress. HEENT: Conjunctivae  and lids unremarkable. Cardiovascular: Regular rhythm.  Lungs: Normal work of breathing. Neurologic: No focal deficits.   Lab Results  Component Value Date   CREATININE 0.8 05/01/2009   BUN 5 (L) 05/01/2009   NA 140 05/01/2009   K 3.7 05/01/2009   CL 109 05/01/2009   No results found for: ALT, AST,  GGT, ALKPHOS, BILITOT Lab Results  Component Value Date   HGBA1C 5.2 03/07/2019   Lab Results  Component Value Date   INSULIN 4.8 03/07/2019   Lab Results  Component Value Date   TSH 1.650 03/07/2019   No results found for: CHOL, HDL, LDLCALC, LDLDIRECT, TRIG, CHOLHDL Lab Results  Component Value Date   WBC 5.4 02/20/2014   HGB 11.8 (L) 02/20/2014   HCT 35.4 03/07/2019   MCV 88.8 02/20/2014   PLT 243 02/20/2014   Lab Results  Component Value Date   IRON 66 03/07/2019   TIBC 349 03/07/2019   FERRITIN 59 03/07/2019    Attestation Statements:   Reviewed by clinician on day of visit: allergies, medications, problem list, medical history, surgical history, family history, social history, and previous encounter notes.  I, Doreene Nest, am acting as transcriptionist for Eber Jones, MD.  I have reviewed the above documentation for accuracy and completeness, and I agree with the above. - Ilene Qua, MD

## 2019-06-20 ENCOUNTER — Encounter (INDEPENDENT_AMBULATORY_CARE_PROVIDER_SITE_OTHER): Payer: Self-pay | Admitting: Family Medicine

## 2019-06-20 ENCOUNTER — Ambulatory Visit (INDEPENDENT_AMBULATORY_CARE_PROVIDER_SITE_OTHER): Payer: 59 | Admitting: Family Medicine

## 2019-06-20 ENCOUNTER — Other Ambulatory Visit: Payer: Self-pay

## 2019-06-20 VITALS — BP 116/79 | HR 91 | Temp 98.0°F | Ht 64.0 in | Wt 200.0 lb

## 2019-06-20 DIAGNOSIS — E559 Vitamin D deficiency, unspecified: Secondary | ICD-10-CM

## 2019-06-20 DIAGNOSIS — F3289 Other specified depressive episodes: Secondary | ICD-10-CM

## 2019-06-20 DIAGNOSIS — E669 Obesity, unspecified: Secondary | ICD-10-CM | POA: Diagnosis not present

## 2019-06-20 DIAGNOSIS — Z6834 Body mass index (BMI) 34.0-34.9, adult: Secondary | ICD-10-CM

## 2019-06-20 NOTE — Progress Notes (Signed)
Chief Complaint:   OBESITY Sarah Villa is here to discuss her progress with her obesity treatment plan along with follow-up of her obesity related diagnoses. Sarah Villa is keeping a food journal of 1100 to 1200 calories and 80+ grams protein and states she is following her eating plan approximately 60% of the time. [name] states she is doing weights at the gym 45 to 60 minutes 2 times per week, and walking on the treadmill 30 minutes, 4 times per week.  Today's visit was #: 6 Starting weight: 200 lbs Starting date: 03/07/2019 Today's weight: 200 lbs Today's date: 06/20/2019 Total lbs lost to date: 0 Total lbs lost since last in-office visit: 0  Interim History: Patient is trying to track calories and she is using [employer]. She started using weights for exercise. She is realizing all of the hidden calories in food, when she eats out. She is trying to find the most accurate choice in [employer]. [name] is averaging approximately 100 grams of protein per day, over the last week.  Subjective:   Vitamin D deficiency Sarah Villa is on OTC vitamin D. She admits fatigue.  Other depression, with emotional eating Sarah Villa is doing better and she is in control of her emotional eating, more than previously. She had one indulgence of lemon pound cake. She has been working on behavior modification techniques to help reduce her emotional eating and has been somewhat successful. She shows no sign of suicidal or homicidal ideations.  Assessment/Plan:   Vitamin D deficiency Low Vitamin D level contributes to fatigue and are associated with obesity, breast, and colon cancer. [name] will continue to take OTC Vitamin D and she will follow-up for routine testing of Vitamin D, at least 2-3 times per year to avoid over-replacement.  Other depression, with emotional eating We will follow up at the next appointment. Orders and follow up as documented in patient record.   Class 1 obesity with serious  comorbidity and body mass index (BMI) of 34.0 to 34.9 in adult, unspecified obesity type Sarah Villa is currently in the action stage of change. As such, her goal is to continue with weight loss efforts. She has agreed to keeping a food journal and adhering to recommended goals of 1100 to 1200 calories and 80+ grams of protein daily.   Exercise goals: Sarah Villa will continue her current exercise regimen.  Behavioral modification strategies: increasing lean protein intake, increasing vegetables, meal planning and cooking strategies, keeping healthy foods in the home and planning for success.  [name] is to follow-up with the pharmacy, concerning pen malfunction.  Sarah Villa has agreed to follow-up with our clinic in 2 weeks. She was informed of the importance of frequent follow-up visits to maximize her success with intensive lifestyle modifications for her multiple health conditions.   Objective:   Blood pressure 116/79, pulse 91, temperature 98 F (36.7 C), temperature source Oral, height 5\' 4"  (1.626 m), weight 200 lb (90.7 kg), last menstrual period 02/14/2017, SpO2 100 %. Body mass index is 34.33 kg/m.  General: Cooperative, alert, well developed, in no acute distress. HEENT: Conjunctivae and lids unremarkable. Cardiovascular: Regular rhythm.  Lungs: Normal work of breathing. Neurologic: No focal deficits.   Lab Results  Component Value Date   CREATININE 0.8 05/01/2009   BUN 5 (L) 05/01/2009   NA 140 05/01/2009   K 3.7 05/01/2009   CL 109 05/01/2009   No results found for: ALT, AST, GGT, ALKPHOS, BILITOT Lab Results  Component Value Date   HGBA1C 5.2 03/07/2019  Lab Results  Component Value Date   INSULIN 4.8 03/07/2019   Lab Results  Component Value Date   TSH 1.650 03/07/2019   No results found for: CHOL, HDL, LDLCALC, LDLDIRECT, TRIG, CHOLHDL Lab Results  Component Value Date   WBC 5.4 02/20/2014   HGB 11.8 (L) 02/20/2014   HCT 35.4 03/07/2019   MCV 88.8 02/20/2014    PLT 243 02/20/2014   Lab Results  Component Value Date   IRON 66 03/07/2019   TIBC 349 03/07/2019   FERRITIN 59 03/07/2019    Attestation Statements:   Reviewed by clinician on day of visit: allergies, medications, problem list, medical history, surgical history, family history, social history, and previous encounter notes.  Time spent on visit including pre-visit chart review and post-visit charting and care was 15 minutes.   I, Doreene Nest, am acting as transcriptionist for Coralie Common, MD.  I have reviewed the above documentation for accuracy and completeness, and I agree with the above. - Ilene Qua, MD

## 2019-07-04 ENCOUNTER — Other Ambulatory Visit: Payer: Self-pay

## 2019-07-04 ENCOUNTER — Encounter (INDEPENDENT_AMBULATORY_CARE_PROVIDER_SITE_OTHER): Payer: Self-pay | Admitting: Family Medicine

## 2019-07-04 ENCOUNTER — Ambulatory Visit (INDEPENDENT_AMBULATORY_CARE_PROVIDER_SITE_OTHER): Payer: 59 | Admitting: Family Medicine

## 2019-07-04 VITALS — BP 106/70 | HR 99 | Temp 98.4°F | Ht 64.0 in | Wt 198.0 lb

## 2019-07-04 DIAGNOSIS — E669 Obesity, unspecified: Secondary | ICD-10-CM

## 2019-07-04 DIAGNOSIS — Z6834 Body mass index (BMI) 34.0-34.9, adult: Secondary | ICD-10-CM | POA: Diagnosis not present

## 2019-07-04 DIAGNOSIS — Z9189 Other specified personal risk factors, not elsewhere classified: Secondary | ICD-10-CM

## 2019-07-04 DIAGNOSIS — E559 Vitamin D deficiency, unspecified: Secondary | ICD-10-CM | POA: Diagnosis not present

## 2019-07-04 DIAGNOSIS — E8881 Metabolic syndrome: Secondary | ICD-10-CM

## 2019-07-04 NOTE — Progress Notes (Signed)
Chief Complaint:   OBESITY Sarah Villa is here to discuss her progress with her obesity treatment plan along with follow-up of her obesity related diagnoses. Sarah Villa is keeping a food journal and adhering to recommended goals of 1100-1200 calories and 80+ grams of protein and states she is following her eating plan approximately 50% of the time. Sarah Villa states she is weightlifting 45 minutes 3 times per week.  Today's visit was #: 7 Starting weight: 200 lbs Starting date: 03/07/2019 Today's weight: 198 lbs Today's date: 07/04/2019 Total lbs lost to date: 2 Total lbs lost since last in-office visit: 2  Interim History: Sarah Villa started lifting weights 3 times per week with her husband for the last 2 weeks. She had 1 indulgent meal of spaghetti. She has not been logging food on My [employer]itness [employer].  Subjective:   Vitamin D deficiency.  Sarah Villa is on Vitamin D 4,000 IU daily. She endorses fatigue. Last Vitamin D level 63.3.  Insulin resistance. Sarah Villa has a diagnosis of insulin resistance based on her elevated fasting insulin level >5. She continues to work on diet and exercise to decrease her risk of diabetes. She reports occasional craving for carbs.  Lab Results  Component Value Date   INSULIN 4.8 03/07/2019   Lab Results  Component Value Date   HGBA1C 5.2 03/07/2019   At risk for diabetes mellitus. Sarah Villa is at higher than average risk for developing diabetes due to her obesity.   Assessment/Plan:   Vitamin D deficiency. Low Vitamin D level contributes to fatigue and are associated with obesity, breast, and colon cancer. She agrees to continue to take Vitamin D daily supplement and will follow-up for routine testing of Vitamin D, at least 2-3 times per year to avoid over-replacement.  Insulin resistance. Sarah Villa will continue to work on weight loss, exercise, and decreasing simple carbohydrates to help decrease the risk of diabetes. Sarah Villa agreed to follow-up with [place] as directed to  closely monitor her progress. Will repeat labs at her next appointment.  At risk for diabetes mellitus. Sarah Villa was given approximately 15 minutes of diabetes education and counseling today. We discussed intensive lifestyle modifications today with an emphasis on weight loss as well as increasing exercise and decreasing simple carbohydrates in her diet. We also reviewed medication options with an emphasis on risk versus benefit of those discussed.   Repetitive spaced learning was employed today to elicit superior memory formation and behavioral change.  Class 1 obesity with serious comorbidity and body mass index (BMI) of 34.0 to 34.9 in adult, unspecified obesity type. Sarah Villa was given a refill on her Liraglutide -Weight Management (SAXENDA) 18 MG/3ML SOPN 3 mg SQ daily #5 pens with 0 refills.  Sarah Villa is currently in the action stage of change. As such, her goal is to continue with weight loss efforts. She has agreed to the [employer] and will journal 200-300 calories and 20+ grams of protein at breakfast.   Exercise goals: Sarah Villa will continue her current exercise regimen.  Behavioral modification strategies: increasing lean protein intake, increasing vegetables, meal planning and cooking strategies, keeping healthy foods in the home, planning for success and keeping a strict food journal.  Sarah Villa has agreed to follow-up with our clinic in 2 weeks. She was informed of the importance of frequent follow-up visits to maximize her success with intensive lifestyle modifications for her multiple health conditions.   Objective:   Blood pressure 106/70, pulse 99, temperature 98.4 F (36.9 C), temperature source Oral, height 5'  4" (1.626 m), weight 198 lb (89.8 kg), last menstrual period 02/14/2017, SpO2 96 %. Body mass index is 33.99 kg/m.  General: Cooperative, alert, well developed, in no acute distress. HEENT: Conjunctivae and lids unremarkable. Cardiovascular: Regular rhythm.  Lungs:  Normal work of breathing. Neurologic: No focal deficits.   Lab Results  Component Value Date   CREATININE 0.8 05/01/2009   BUN 5 (L) 05/01/2009   NA 140 05/01/2009   K 3.7 05/01/2009   CL 109 05/01/2009   No results found for: ALT, AST, GGT, ALKPHOS, BILITOT Lab Results  Component Value Date   HGBA1C 5.2 03/07/2019   Lab Results  Component Value Date   INSULIN 4.8 03/07/2019   Lab Results  Component Value Date   TSH 1.650 03/07/2019   No results found for: CHOL, HDL, LDLCALC, LDLDIRECT, TRIG, CHOLHDL Lab Results  Component Value Date   WBC 5.4 02/20/2014   HGB 11.8 (L) 02/20/2014   HCT 35.4 03/07/2019   MCV 88.8 02/20/2014   PLT 243 02/20/2014   Lab Results  Component Value Date   IRON 66 03/07/2019   TIBC 349 03/07/2019   FERRITIN 59 03/07/2019   Attestation Statements:   Reviewed by clinician on day of visit: allergies, medications, problem list, medical history, surgical history, family history, social history, and previous encounter notes.  I, Michaelene Song, am acting as transcriptionist for Coralie Common, MD   I have reviewed the above documentation for accuracy and completeness, and I agree with the above. - Ilene Qua, MD

## 2019-07-05 MED ORDER — SAXENDA 18 MG/3ML ~~LOC~~ SOPN: 3.0000 mg | PEN_INJECTOR | Freq: Every day | SUBCUTANEOUS | 0 refills | Status: DC

## 2019-07-07 ENCOUNTER — Other Ambulatory Visit (INDEPENDENT_AMBULATORY_CARE_PROVIDER_SITE_OTHER): Payer: Self-pay | Admitting: Family Medicine

## 2019-07-07 DIAGNOSIS — E669 Obesity, unspecified: Secondary | ICD-10-CM

## 2019-07-07 DIAGNOSIS — Z6834 Body mass index (BMI) 34.0-34.9, adult: Secondary | ICD-10-CM

## 2019-07-27 ENCOUNTER — Other Ambulatory Visit: Payer: Self-pay

## 2019-07-27 ENCOUNTER — Encounter (INDEPENDENT_AMBULATORY_CARE_PROVIDER_SITE_OTHER): Payer: Self-pay | Admitting: Family Medicine

## 2019-07-27 ENCOUNTER — Ambulatory Visit (INDEPENDENT_AMBULATORY_CARE_PROVIDER_SITE_OTHER): Payer: 59 | Admitting: Family Medicine

## 2019-07-27 VITALS — BP 114/74 | HR 90 | Temp 97.8°F | Ht 64.0 in | Wt 199.0 lb

## 2019-07-27 DIAGNOSIS — Z6834 Body mass index (BMI) 34.0-34.9, adult: Secondary | ICD-10-CM

## 2019-07-27 DIAGNOSIS — Z9189 Other specified personal risk factors, not elsewhere classified: Secondary | ICD-10-CM | POA: Diagnosis not present

## 2019-07-27 DIAGNOSIS — R739 Hyperglycemia, unspecified: Secondary | ICD-10-CM

## 2019-07-27 DIAGNOSIS — E559 Vitamin D deficiency, unspecified: Secondary | ICD-10-CM

## 2019-07-27 DIAGNOSIS — E669 Obesity, unspecified: Secondary | ICD-10-CM

## 2019-07-28 LAB — COMPREHENSIVE METABOLIC PANEL
ALT: 20 IU/L (ref 0–32)
AST: 15 IU/L (ref 0–40)
Albumin/Globulin Ratio: 1.7 (ref 1.2–2.2)
Albumin: 4 g/dL (ref 3.8–4.9)
Alkaline Phosphatase: 90 IU/L (ref 39–117)
BUN/Creatinine Ratio: 22 (ref 9–23)
BUN: 17 mg/dL (ref 6–24)
Bilirubin Total: 0.5 mg/dL (ref 0.0–1.2)
CO2: 24 mmol/L (ref 20–29)
Calcium: 9.3 mg/dL (ref 8.7–10.2)
Chloride: 107 mmol/L — ABNORMAL HIGH (ref 96–106)
Creatinine, Ser: 0.76 mg/dL (ref 0.57–1.00)
GFR calc Af Amer: 103 mL/min/{1.73_m2} (ref >59)
GFR calc non Af Amer: 89 mL/min/{1.73_m2} (ref >59)
Globulin, Total: 2.4 g/dL (ref 1.5–4.5)
Glucose: 87 mg/dL (ref 65–99)
Potassium: 4.1 mmol/L (ref 3.5–5.2)
Sodium: 142 mmol/L (ref 134–144)
Total Protein: 6.4 g/dL (ref 6.0–8.5)

## 2019-07-28 LAB — HEMOGLOBIN A1C
Est. average glucose Bld gHb Est-mCnc: 91 mg/dL
Hgb A1c MFr Bld: 4.8 % (ref 4.8–5.6)

## 2019-07-28 LAB — VITAMIN D 25 HYDROXY (VIT D DEFICIENCY, FRACTURES): Vit D, 25-Hydroxy: 48.7 ng/mL (ref 30.0–100.0)

## 2019-07-28 LAB — INSULIN, RANDOM: INSULIN: 4.1 u[IU]/mL (ref 2.6–24.9)

## 2019-07-31 NOTE — Progress Notes (Signed)
Chief Complaint:   OBESITY Sarah Villa is here to discuss her progress with her obesity treatment plan along with follow-up of her obesity related diagnoses. Sarah Villa is on the [employer] and keeping a food journal and adhering to recommended goals of 200-300 calories and 20+ grams of protein at breakfast daily and states she is following her eating plan approximately 80% of the time. Sarah Villa states she is doing weights and cardio for 30-60 minutes 3-5 times per week.  Today's visit was #: 8 Starting weight: 200 lbs Starting date: 03/07/2019 Today's weight: 199 lbs Today's date: 07/27/2019 Total lbs lost to date: 1 Total lbs lost since last in-office visit: 0  Interim History: Sarah Villa stopped taking Saxenda because she felt it was making her not eat. She is doing 40-50 % of the Pescatarian plan, then trying to be mindful of what she is eating and focusing on meat and veggies. She is doing eggs and sausage at breakfast, protein and vegetables at lunch, and doing fruit as a snack.  Subjective:   1. Vitamin D deficiency Sarah Villa denies nausea, vomiting, or muscle weakness, but she notes fatigue. She is on 4,000 IU OTC Vit D daily.  2. Hyperglycemia Sarah Villa was previously on Saxenda. Last A1c and insulin were within normal limits.  3. At risk for osteoporosis Sarah Villa is at higher risk of osteopenia and osteoporosis due to Vitamin D deficiency.   Assessment/Plan:   1. Vitamin D deficiency Low Vitamin D level contributes to fatigue and are associated with obesity, breast, and colon cancer. Sarah Villa agrees to continue OTC Vit D and will follow-up for routine testing of Vitamin D, at least 2-3 times per year to avoid over-replacement. We will check labs today.  - VITAMIN D 25 Hydroxy (Vit-D Deficiency, Fractures)  2. Hyperglycemia Fasting labs will be obtained today and results with be discussed with Sarah Villa in 2 weeks at her follow up visit. In the meanwhile Sarah Villa will continue her meal plan  and will work on weight loss efforts.  - Comprehensive metabolic panel - Hemoglobin A1c - Insulin, random  3. At risk for osteoporosis Sarah Villa was given approximately 15 minutes of osteoporosis prevention counseling today. Sarah Villa is at risk for osteopenia and osteoporosis due to her Vitamin D deficiency. She was encouraged to take her Vitamin D and follow her higher calcium diet and increase strengthening exercise to help strengthen her bones and decrease her risk of osteopenia and osteoporosis.  Repetitive spaced learning was employed today to elicit superior memory formation and behavioral change.  4. Class 1 obesity with serious comorbidity and body mass index (BMI) of 34.0 to 34.9 in adult, unspecified obesity type Sarah Villa is currently in the action stage of change. As such, her goal is to continue with weight loss efforts. She has agreed to the [employer].   Exercise goals: As is.  Behavioral modification strategies: increasing lean protein intake, increasing vegetables, meal planning and cooking strategies, keeping healthy foods in the home and planning for success.  Sarah Villa has agreed to follow-up with our clinic in 2 weeks. She was informed of the importance of frequent follow-up visits to maximize her success with intensive lifestyle modifications for her multiple health conditions.   Sarah Villa was informed we would discuss her lab results at her next visit unless there is a critical issue that needs to be addressed sooner. Sarah Villa agreed to keep her next visit at the agreed upon time to discuss these results.  Objective:   Blood pressure 114/74, pulse  90, temperature 97.8 F (36.6 C), temperature source Oral, height 5\' 4"  (1.626 m), weight 199 lb (90.3 kg), last menstrual period 02/14/2017, SpO2 100 %. Body mass index is 34.16 kg/m.  General: Cooperative, alert, well developed, in no acute distress. HEENT: Conjunctivae and lids unremarkable. Cardiovascular: Regular rhythm.    Lungs: Normal work of breathing. Neurologic: No focal deficits.   Lab Results  Component Value Date   CREATININE 0.76 07/27/2019   BUN 17 07/27/2019   NA 142 07/27/2019   K 4.1 07/27/2019   CL 107 (H) 07/27/2019   CO2 24 07/27/2019   Lab Results  Component Value Date   ALT 20 07/27/2019   AST 15 07/27/2019   ALKPHOS 90 07/27/2019   BILITOT 0.5 07/27/2019   Lab Results  Component Value Date   HGBA1C 4.8 07/27/2019   HGBA1C 5.2 03/07/2019   Lab Results  Component Value Date   INSULIN 4.1 07/27/2019   INSULIN 4.8 03/07/2019   Lab Results  Component Value Date   TSH 1.650 03/07/2019   No results found for: CHOL, HDL, LDLCALC, LDLDIRECT, TRIG, CHOLHDL Lab Results  Component Value Date   WBC 5.4 02/20/2014   HGB 11.8 (L) 02/20/2014   HCT 35.4 03/07/2019   MCV 88.8 02/20/2014   PLT 243 02/20/2014   Lab Results  Component Value Date   IRON 66 03/07/2019   TIBC 349 03/07/2019   FERRITIN 59 03/07/2019   Attestation Statements:   Reviewed by clinician on day of visit: allergies, medications, problem list, medical history, surgical history, family history, social history, and previous encounter notes.   I, Trixie Dredge, am acting as transcriptionist for April Manson, MD.  I have reviewed the above documentation for accuracy and completeness, and I agree with the above. - Ilene Qua, MD

## 2019-08-17 ENCOUNTER — Ambulatory Visit (INDEPENDENT_AMBULATORY_CARE_PROVIDER_SITE_OTHER): Payer: 59 | Admitting: Family Medicine

## 2019-08-17 ENCOUNTER — Encounter (INDEPENDENT_AMBULATORY_CARE_PROVIDER_SITE_OTHER): Payer: Self-pay

## 2019-08-18 DIAGNOSIS — R21 Rash and other nonspecific skin eruption: Secondary | ICD-10-CM | POA: Diagnosis not present

## 2019-08-18 DIAGNOSIS — L255 Unspecified contact dermatitis due to plants, except food: Secondary | ICD-10-CM | POA: Diagnosis not present

## 2020-01-24 ENCOUNTER — Other Ambulatory Visit: Payer: Self-pay | Admitting: Obstetrics and Gynecology

## 2020-01-24 DIAGNOSIS — Z1231 Encounter for screening mammogram for malignant neoplasm of breast: Secondary | ICD-10-CM | POA: Diagnosis not present

## 2020-01-24 DIAGNOSIS — D259 Leiomyoma of uterus, unspecified: Secondary | ICD-10-CM | POA: Diagnosis not present

## 2020-01-24 DIAGNOSIS — Z01419 Encounter for gynecological examination (general) (routine) without abnormal findings: Secondary | ICD-10-CM | POA: Diagnosis not present

## 2020-01-24 DIAGNOSIS — N3946 Mixed incontinence: Secondary | ICD-10-CM | POA: Diagnosis not present

## 2020-01-24 DIAGNOSIS — Z6834 Body mass index (BMI) 34.0-34.9, adult: Secondary | ICD-10-CM | POA: Diagnosis not present

## 2020-01-24 DIAGNOSIS — R3914 Feeling of incomplete bladder emptying: Secondary | ICD-10-CM | POA: Diagnosis not present

## 2020-01-24 DIAGNOSIS — N941 Unspecified dyspareunia: Secondary | ICD-10-CM | POA: Diagnosis not present

## 2020-02-22 DIAGNOSIS — N390 Urinary tract infection, site not specified: Secondary | ICD-10-CM | POA: Diagnosis not present

## 2020-02-22 DIAGNOSIS — R339 Retention of urine, unspecified: Secondary | ICD-10-CM | POA: Diagnosis not present

## 2020-02-22 DIAGNOSIS — N941 Unspecified dyspareunia: Secondary | ICD-10-CM | POA: Diagnosis not present

## 2020-02-22 DIAGNOSIS — D259 Leiomyoma of uterus, unspecified: Secondary | ICD-10-CM | POA: Diagnosis not present

## 2020-03-06 ENCOUNTER — Other Ambulatory Visit: Payer: Self-pay | Admitting: Obstetrics and Gynecology

## 2020-04-04 ENCOUNTER — Ambulatory Visit: Payer: 59 | Attending: Critical Care Medicine

## 2020-04-04 DIAGNOSIS — Z23 Encounter for immunization: Secondary | ICD-10-CM

## 2020-04-04 NOTE — Progress Notes (Signed)
   Covid-19 Vaccination Clinic  Name:  Sarah Villa    MRN: [id number] DOB: [birth year]  04/04/2020  [name] was observed post Covid-19 immunization for 15 minutes without incident. She was provided with Vaccine Information Sheet and instruction to access the V-Safe system.   [name] was instructed to call 911 with any severe reactions post vaccine: Marland Kitchen Difficulty breathing  . Swelling of face and throat  . A fast heartbeat  . A bad rash all over body  . Dizziness and weakness   Immunizations Administered    Name Date Dose VIS Date Route   Pfizer COVID-19 Vaccine 04/04/2020  5:30 PM 0.3 mL 01/31/2020 Intramuscular   Manufacturer: [place]   Lot: [id number]   NDC: [address]-6

## 2020-05-01 DIAGNOSIS — D259 Leiomyoma of uterus, unspecified: Secondary | ICD-10-CM | POA: Diagnosis not present

## 2020-05-28 DIAGNOSIS — J019 Acute sinusitis, unspecified: Secondary | ICD-10-CM | POA: Diagnosis not present

## 2020-09-16 ENCOUNTER — Other Ambulatory Visit: Payer: Self-pay

## 2020-09-16 MED ORDER — CARESTART COVID-19 HOME TEST VI KIT
PACK | 0 refills | Status: DC
  Filled 2020-09-16: qty 2, 4d supply, fill #0

## 2020-09-18 DIAGNOSIS — U071 COVID-19: Secondary | ICD-10-CM | POA: Diagnosis not present

## 2020-09-19 ENCOUNTER — Other Ambulatory Visit (HOSPITAL_BASED_OUTPATIENT_CLINIC_OR_DEPARTMENT_OTHER): Payer: Self-pay

## 2020-09-19 MED ORDER — BENZONATATE 200 MG PO CAPS
ORAL_CAPSULE | ORAL | 0 refills | Status: DC
  Filled 2020-09-19: qty 30, 10d supply, fill #0

## 2020-10-07 ENCOUNTER — Other Ambulatory Visit: Payer: Self-pay

## 2020-10-07 DIAGNOSIS — E6609 Other obesity due to excess calories: Secondary | ICD-10-CM | POA: Diagnosis not present

## 2020-10-07 MED ORDER — CONTRAVE 8-90 MG PO TB12
ORAL_TABLET | ORAL | 3 refills | Status: DC
  Filled 2020-10-07: qty 120, 30d supply, fill #0
  Filled 2020-10-16: qty 120, 60d supply, fill #0
  Filled 2020-11-29: qty 120, 60d supply, fill #1

## 2020-10-07 MED ORDER — NALTREXONE-BUPROPION HCL ER 8-90 MG PO TB12: ORAL_TABLET | ORAL | 1 refills | Status: DC

## 2020-10-16 ENCOUNTER — Other Ambulatory Visit: Payer: Self-pay

## 2020-11-29 ENCOUNTER — Other Ambulatory Visit: Payer: Self-pay

## 2020-12-03 ENCOUNTER — Other Ambulatory Visit: Payer: Self-pay

## 2020-12-27 ENCOUNTER — Other Ambulatory Visit: Payer: Self-pay

## 2021-01-29 DIAGNOSIS — Z23 Encounter for immunization: Secondary | ICD-10-CM | POA: Diagnosis not present

## 2021-01-29 DIAGNOSIS — Z1159 Encounter for screening for other viral diseases: Secondary | ICD-10-CM | POA: Diagnosis not present

## 2021-01-29 DIAGNOSIS — H6122 Impacted cerumen, left ear: Secondary | ICD-10-CM | POA: Diagnosis not present

## 2021-01-29 DIAGNOSIS — E6609 Other obesity due to excess calories: Secondary | ICD-10-CM | POA: Diagnosis not present

## 2021-01-29 DIAGNOSIS — Z0000 Encounter for general adult medical examination without abnormal findings: Secondary | ICD-10-CM | POA: Diagnosis not present

## 2021-01-29 DIAGNOSIS — E559 Vitamin D deficiency, unspecified: Secondary | ICD-10-CM | POA: Diagnosis not present

## 2021-01-29 DIAGNOSIS — Z13220 Encounter for screening for lipoid disorders: Secondary | ICD-10-CM | POA: Diagnosis not present

## 2021-01-30 DIAGNOSIS — Z01419 Encounter for gynecological examination (general) (routine) without abnormal findings: Secondary | ICD-10-CM | POA: Diagnosis not present

## 2021-01-30 DIAGNOSIS — Z6837 Body mass index (BMI) 37.0-37.9, adult: Secondary | ICD-10-CM | POA: Diagnosis not present

## 2021-01-30 DIAGNOSIS — Z1231 Encounter for screening mammogram for malignant neoplasm of breast: Secondary | ICD-10-CM | POA: Diagnosis not present

## 2021-01-30 DIAGNOSIS — Z124 Encounter for screening for malignant neoplasm of cervix: Secondary | ICD-10-CM | POA: Diagnosis not present

## 2021-04-25 ENCOUNTER — Ambulatory Visit: Payer: 59 | Attending: Internal Medicine

## 2021-04-25 ENCOUNTER — Other Ambulatory Visit: Payer: Self-pay

## 2021-04-25 ENCOUNTER — Other Ambulatory Visit (HOSPITAL_BASED_OUTPATIENT_CLINIC_OR_DEPARTMENT_OTHER): Payer: Self-pay

## 2021-04-25 DIAGNOSIS — Z23 Encounter for immunization: Secondary | ICD-10-CM

## 2021-04-28 NOTE — Progress Notes (Signed)
° °  Covid-19 Vaccination Clinic  Name:  Sarah Villa    MRN: [id number] DOB: May 20, 1965  04/28/2021  [name] was observed post Covid-19 immunization for 15 minutes without incident. She was provided with Vaccine Information Sheet and instruction to access the V-Safe system.   [name] was instructed to call 911 with any severe reactions post vaccine: Difficulty breathing  Swelling of face and throat  A fast heartbeat  A bad rash all over body  Dizziness and weakness   Immunizations Administered     Name Date Dose VIS Date Route   Pfizer Covid-19 Vaccine Bivalent Booster 04/25/2021  4:12 PM 0.3 mL 12/11/2020 Intramuscular   Manufacturer: [place]   Lot: [id number]   [place]: [id number]

## 2021-11-19 ENCOUNTER — Encounter (INDEPENDENT_AMBULATORY_CARE_PROVIDER_SITE_OTHER): Payer: Self-pay

## 2022-02-03 DIAGNOSIS — Z1211 Encounter for screening for malignant neoplasm of colon: Secondary | ICD-10-CM | POA: Diagnosis not present

## 2022-02-03 DIAGNOSIS — Z01419 Encounter for gynecological examination (general) (routine) without abnormal findings: Secondary | ICD-10-CM | POA: Diagnosis not present

## 2022-02-03 DIAGNOSIS — Z124 Encounter for screening for malignant neoplasm of cervix: Secondary | ICD-10-CM | POA: Diagnosis not present

## 2022-02-03 DIAGNOSIS — Z6837 Body mass index (BMI) 37.0-37.9, adult: Secondary | ICD-10-CM | POA: Diagnosis not present

## 2022-02-03 DIAGNOSIS — Z1231 Encounter for screening mammogram for malignant neoplasm of breast: Secondary | ICD-10-CM | POA: Diagnosis not present

## 2022-02-03 DIAGNOSIS — D259 Leiomyoma of uterus, unspecified: Secondary | ICD-10-CM | POA: Diagnosis not present

## 2022-02-03 DIAGNOSIS — R339 Retention of urine, unspecified: Secondary | ICD-10-CM | POA: Diagnosis not present

## 2022-02-03 DIAGNOSIS — R3129 Other microscopic hematuria: Secondary | ICD-10-CM | POA: Diagnosis not present

## 2022-02-09 ENCOUNTER — Other Ambulatory Visit (HOSPITAL_BASED_OUTPATIENT_CLINIC_OR_DEPARTMENT_OTHER): Payer: Self-pay

## 2022-02-09 MED ORDER — FLUARIX QUADRIVALENT 0.5 ML IM SUSY
PREFILLED_SYRINGE | INTRAMUSCULAR | 0 refills | Status: DC
  Filled 2022-02-09: qty 0.5, 1d supply, fill #0

## 2022-02-17 DIAGNOSIS — R3129 Other microscopic hematuria: Secondary | ICD-10-CM | POA: Diagnosis not present

## 2022-02-17 DIAGNOSIS — N941 Unspecified dyspareunia: Secondary | ICD-10-CM | POA: Diagnosis not present

## 2022-02-17 DIAGNOSIS — R339 Retention of urine, unspecified: Secondary | ICD-10-CM | POA: Diagnosis not present

## 2022-02-17 DIAGNOSIS — D259 Leiomyoma of uterus, unspecified: Secondary | ICD-10-CM | POA: Diagnosis not present

## 2022-02-17 DIAGNOSIS — R1900 Intra-abdominal and pelvic swelling, mass and lump, unspecified site: Secondary | ICD-10-CM | POA: Diagnosis not present

## 2022-02-18 ENCOUNTER — Other Ambulatory Visit (HOSPITAL_COMMUNITY): Payer: Self-pay | Admitting: Obstetrics and Gynecology

## 2022-02-18 DIAGNOSIS — R1900 Intra-abdominal and pelvic swelling, mass and lump, unspecified site: Secondary | ICD-10-CM

## 2022-02-28 ENCOUNTER — Ambulatory Visit (HOSPITAL_COMMUNITY)
Admission: RE | Admit: 2022-02-28 | Discharge: 2022-02-28 | Disposition: A | Discharge status: Home / Self Care | Payer: 59 | Source: Ambulatory Visit | Attending: Obstetrics and Gynecology | Admitting: Obstetrics and Gynecology

## 2022-02-28 DIAGNOSIS — R1900 Intra-abdominal and pelvic swelling, mass and lump, unspecified site: Secondary | ICD-10-CM | POA: Diagnosis not present

## 2022-02-28 DIAGNOSIS — D251 Intramural leiomyoma of uterus: Secondary | ICD-10-CM | POA: Diagnosis not present

## 2022-02-28 MED ORDER — GADOBUTROL 1 MMOL/ML IV SOLN
9.0000 mL | Freq: Once | INTRAVENOUS | Status: AC | PRN
  Administered 2022-02-28: 9 mL via INTRAVENOUS

## 2022-04-01 DIAGNOSIS — E559 Vitamin D deficiency, unspecified: Secondary | ICD-10-CM | POA: Diagnosis not present

## 2022-04-01 DIAGNOSIS — E6609 Other obesity due to excess calories: Secondary | ICD-10-CM | POA: Diagnosis not present

## 2022-04-01 DIAGNOSIS — Z13220 Encounter for screening for lipoid disorders: Secondary | ICD-10-CM | POA: Diagnosis not present

## 2022-04-01 DIAGNOSIS — Z0000 Encounter for general adult medical examination without abnormal findings: Secondary | ICD-10-CM | POA: Diagnosis not present

## 2022-04-01 DIAGNOSIS — D509 Iron deficiency anemia, unspecified: Secondary | ICD-10-CM | POA: Diagnosis not present

## 2022-04-01 DIAGNOSIS — Z23 Encounter for immunization: Secondary | ICD-10-CM | POA: Diagnosis not present

## 2022-04-08 ENCOUNTER — Other Ambulatory Visit (HOSPITAL_BASED_OUTPATIENT_CLINIC_OR_DEPARTMENT_OTHER): Payer: Self-pay

## 2022-04-08 MED ORDER — FERROUS SULFATE 325 (65 FE) MG PO TABS
325.0000 mg | ORAL_TABLET | Freq: Every day | ORAL | 5 refills | Status: AC
  Filled 2022-04-08: qty 100, 100d supply, fill #0

## 2022-04-24 DIAGNOSIS — R3916 Straining to void: Secondary | ICD-10-CM | POA: Diagnosis not present

## 2022-04-24 DIAGNOSIS — R1909 Other intra-abdominal and pelvic swelling, mass and lump: Secondary | ICD-10-CM | POA: Diagnosis not present

## 2022-04-24 DIAGNOSIS — R3914 Feeling of incomplete bladder emptying: Secondary | ICD-10-CM | POA: Diagnosis not present

## 2022-04-24 DIAGNOSIS — N3946 Mixed incontinence: Secondary | ICD-10-CM | POA: Diagnosis not present

## 2022-04-30 ENCOUNTER — Telehealth: Payer: Self-pay | Admitting: *Deleted

## 2022-04-30 NOTE — Telephone Encounter (Signed)
Spoke with [place] regarding her referral to GYN oncology. She has an appointment scheduled with Dr. Berline Lopes on 05/15/22 at 9:00. Patient agrees to date and time. She has been provided with office address and location. She is also aware of our mask and visitor policy. Patient verbalized understanding and will call with any questions.

## 2022-04-30 NOTE — Telephone Encounter (Signed)
LMOM for the patient to call the office back. Patient needs to be scheduled for a new patient appt.

## 2022-05-14 ENCOUNTER — Encounter: Payer: Self-pay | Admitting: Gynecologic Oncology

## 2022-05-14 NOTE — Progress Notes (Signed)
GYNECOLOGIC ONCOLOGY NEW PATIENT CONSULTATION   Patient Name: Sarah Villa  Patient Age: 57 y.o. Date of Service: 05/15/22 Referring Provider: Dr. Jacalyn Lefevre  Primary Care Provider: Harlan Stains, MD Consulting Provider: Jeral Pinch, MD   Assessment/Plan:  Postmenopausal patient with long history of uterine fibroids and urinary symptoms.  We discussed recent imaging and looked at her MRI pictures together.  She has an enlarged fibroid uterus, which has been followed for some time.  There is a retropubic mass, without apparent connection to the uterus, the appears to be separate from both the bladder and the urethra.    Given her long history of urinary symptoms, my suspicion is that it is the fibroid itself that is causing her symptoms.  I think the retropubic mass is likely exacerbating things and causing some compression of the bladder at its base between the mass itself and the fibroid.  Patient has had years of imaging since her prior myomectomy surgery.  Unfortunately, this was with her OB/GYN.  These ultrasounds were all done in the office.  I had the patient sign a release of records to see if we can obtain any of her prior imaging studies.  My suspicion is that even if this retropubic mass had been there previously, it may have easily been missed on a transvaginal ultrasound.  From a symptom standpoint, I recommend that we proceed with surgery.  We discussed several options with regards to surgery.  Again, since I think the retropubic mass is not causing the symptoms she is experiencing from a urinary standpoint, we could either pursue biopsy of this mass or excision.  On imaging, the mass does not have characteristics that significantly suggest that it is malignant in nature.  At the same time, it appears different than the adjacent uterus.  If we pursue surgical excision, I would recommend that this be done at the same time as a total hysterectomy.  Given her postmenopausal  status, discussed concurrent bilateral salpingo-oophorectomy at the time of total hysterectomy.  The patient is interested in moving forward with surgical excision of both her uterus and the retropubic mass.  Given the location adjacent to the bladder and the urethra, I suggested that we schedule the surgery on a day where Dr. Claudia Desanctis is at least available in the event of any injury to the urethra.  I will send Dr. Claudia Desanctis a message to work on coordinating a date for surgery over the next couple of months.  We discussed that the plan would be to send both the uterus and this retropubic mass to pathology for frozen section at the time of surgery.  Any additional staging surgery, such as biopsies or lymphadenectomy would be dictated by frozen section results.  We specifically discussed the risk of injury to the bladder and the urethra.  Being on need of dissection at the time of surgery, the patient may be discharged with a Foley catheter for short period of time.  We reviewed the plan for a robotic assisted hysterectomy, bilateral salpingo-oophorectomy, resection of retropubic mass, possible staging, possible laparotomy. The risks of surgery were discussed in detail and she understands these to include infection; wound separation; hernia; vaginal cuff separation, injury to adjacent organs such as bowel, bladder, blood vessels, ureters and nerves; bleeding which may require blood transfusion; anesthesia risk; thromboembolic events; possible death; unforeseen complications; possible need for re-exploration; medical complications such as heart attack, stroke, pleural effusion and pneumonia; and, if full lymphadenectomy is performed the risk of lymphedema  and lymphocyst. The patient will receive DVT and antibiotic prophylaxis as indicated. She voiced a clear understanding. She had the opportunity to ask questions. Perioperative instructions were reviewed with her. Prescriptions for post-op medications were sent to her  pharmacy of choice.  A copy of this note was sent to the patient's referring provider.   70 minutes of total time was spent for this patient encounter, including preparation, face-to-face counseling with the patient and coordination of care, and documentation of the encounter.  Jeral Pinch, MD  Division of Gynecologic Oncology  Department of Obstetrics and Gynecology  [facility]  ___________________________________________  Chief Complaint: Chief Complaint  Patient presents with   Pelvic mass    History of Present Illness:  Sarah Villa is a 57 y.o. y.o. female who is seen in consultation at the request of Dr. Claudia Desanctis for an evaluation of a retropubic mass.  The patient endorsed difficulty initiating urinary stream, dyspareunia and urinary incontinence.  MRI of the pelvis was obtained  on 02/28/22 by her primary care provider showing a 3.8 cm retropubic mass pushing on the bladder and urethra.  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  A single 6.7 cm uterine fibroid in the central uterine corpus and fundus, with intramural and submucosal components, was also noted. No intracavitary or pedunculated fibroids identified. Normal appearance of both ovaries. No adnexal mass identified.  Today, the patient reports a long history of known uterine fibroids.  She was followed for many years by Dr. Leo Grosser, who has since retired.  Patient underwent laparoscopic myomectomy at least 20 years ago although her larger uterine fibroid was left given concern that removal of this fibroid would cause significant risk of needing hysterectomy.  The plan had been to watch her through menopause at which time the hope was that her fibroid might shrink.  Patient's history is also notable for 2 D&Cs as part of treatment and/or workup of infertility.  She also has had 3 ectopic surgeries including left salpingectomy and was treated for  fourth ectopic pregnancy with methotrexate.  Notes that over the last 5+ years she has had multiple symptoms including intermittent urinary incontinence, now has to wear a pad.  She also has intermittent retention.  She describes this as sometimes having to urinate in the morning when she gets up but being unable to do so.  She also has dyspareunia.  She thinks the symptoms have worsened somewhat recently.  She endorses intermittent constipation and diarrhea at baseline.  Her weight has been up and down.  She is currently on a trial drug for weight loss, thinks that this is peptide therapy.  She has been taking weekly injections for 8 months.  She denies any vaginal bleeding or discharge.  PAST MEDICAL HISTORY:  Past Medical History:  Diagnosis Date   Anemia    Anemia    B12 deficiency    Chlamydia    Herpes    History of ectopic pregnancy    History of infertility, female    Infertility, female    Menorrhagia    Obesity    Yeast infection      PAST SURGICAL HISTORY:  Past Surgical History:  Procedure Laterality Date   DILATATION & CURETTAGE/HYSTEROSCOPY WITH MYOSURE N/A 02/20/2014   Procedure: DILATATION & CURETTAGE/HYSTEROSCOPY WITH MYOSURE;  Surgeon: Eldred Manges, MD;  Location: [facility];  Service: Gynecology;  Laterality: N/A;   DILATION AND CURETTAGE OF UTERUS  LAPAROSCOPIC [place]EL[place] ASSISTED MYOMECTOMY     SALPINGECTOMY Left    for ectopic    OB/GYN HISTORY:  OB History  Gravida Para Term Preterm AB Living  5 0     5 0  SAB IAB Ectopic Multiple Live Births      5        # Outcome Date GA Lbr Len/2nd Weight Sex Delivery Anes PTL Lv  5 Ectopic           4 Ectopic           3 Ectopic           2 Ectopic           1 Ectopic             Patient's last menstrual period was 02/14/2017.  Age at menarche: [age] Age at menopause: 74 Hx of HRT: Denies Hx of STDs: Patient denies (review of OBGYN records state CT and HSV history) Last pap: 2022 History of abnormal  pap smears: Denies  SCREENING STUDIES:  Last mammogram: 2023  Last colonoscopy: 57 years old  MEDICATIONS: Outpatient Encounter Medications as of 05/15/2022  Medication Sig   Cholecalciferol (VITAMIN D3) 50 MCG (2000 UT) capsule Take 2,000 Units by mouth daily.   COVID-19 At Home Antigen Test [facility] COVID-19 HOME TEST) KIT use as directed   ferrous sulfate 325 (65 FE) MG tablet Take 1 tablet by mouth once daily.   ibuprofen (ADVIL) 800 MG tablet Take 1 tablet (800 mg total) by mouth every 8 (eight) hours as needed for moderate pain. For AFTER surgery only   influenza vac split quadrivalent PF (FLUARIX QUADRIVALENT) 0.5 ML injection Inject into the muscle.   senna-docusate (SENOKOT-S) 8.6-50 MG tablet Take 2 tablets by mouth at bedtime. For AFTER surgery, do not take if having diarrhea   traMADol (ULTRAM) 50 MG tablet Take 1 tablet (50 mg total) by mouth every 6 (six) hours as needed for severe pain. For AFTER surgery only, do not take and drive   [DISCONTINUED] Cholecalciferol (HM VITAMIN D3) 4000 UNITS CAPS daily.   [DISCONTINUED] loratadine-pseudoephedrine (CLARITIN-D 12-HOUR) 5-120 MG per tablet Take 1 tablet by mouth daily as needed for allergies. Reported on 08/07/2015   [DISCONTINUED] benzonatate (TESSALON) 200 MG capsule Take 1 capsule by mouth three times a day for 10 days   [DISCONTINUED] Liraglutide -Weight Management (SAXENDA) 18 MG/3ML SOPN Inject 0.5 mLs (3 mg total) into the skin daily. (Patient not taking: Reported on 07/27/2019)   [DISCONTINUED] Naltrexone-buPROPion HCl ER 8-90 MG TB12 wk 1: take 1 tab every morning, wk 2: 1 tab morning & 1 tab evening, wk 3: 2 tabs morning & 1 tab evening, wk 4: 2 tabs morning & 2 tabs evening   [DISCONTINUED] UNIFINE PENTIPS 32G X 4 MM MISC USE 2 TIMES DAILY.   No facility-administered encounter medications on file as of 05/15/2022.    ALLERGIES:  No Known Allergies   FAMILY HISTORY:  Family History  Problem Relation Age of Onset    Hypertension Mother    Depression Father    Anxiety disorder Father    Bipolar disorder Father    Cancer Maternal Grandmother        breast? did die from this   Breast cancer Maternal Grandmother    Heart disease Paternal Grandmother    Hypertension Paternal Grandmother    Colon cancer Neg Hx    Ovarian cancer Neg Hx    Endometrial cancer Neg Hx  Pancreatic cancer Neg Hx    Prostate cancer Neg Hx      SOCIAL HISTORY:  Social Connections: Socially Integrated (05/14/2022)   Social Connection and Isolation Panel [NHANES]    Frequency of Communication with Friends and Family: More than three times a week    Frequency of Social Gatherings with Friends and Family: Not on file    Attends Religious Services: More than 4 times per year    Active Member of [employer] or Organizations: Yes    Attends Music therapist: More than 4 times per year    Marital Status: Married    REVIEW OF SYSTEMS:  + dyspareunia, urinary symptoms Denies appetite changes, fevers, chills, fatigue, unexplained weight changes. Denies hearing loss, neck lumps or masses, mouth sores, ringing in ears or voice changes. Denies cough or wheezing.  Denies shortness of breath. Denies chest pain or palpitations. Denies leg swelling. Denies abdominal distention, pain, blood in stools, constipation, diarrhea, nausea, vomiting, or early satiety. Denies dysuria, hematuria. Denies hot flashes, pelvic pain, vaginal bleeding or vaginal discharge.   Denies joint pain, back pain or muscle pain/cramps. Denies itching, rash, or wounds. Denies dizziness, headaches, numbness or seizures. Denies swollen lymph nodes or glands, denies easy bruising or bleeding. Denies anxiety, depression, confusion, or decreased concentration.  Physical Exam:  Vital Signs for this encounter:  Blood pressure 120/77, pulse 83, temperature 97.8 F (36.6 C), resp. rate 20, weight 222 lb 6.4 oz (100.9 kg), last menstrual period 02/14/2017, SpO2  100 %. Body mass index is 38.17 kg/m. General: Alert, oriented, no acute distress.  HEENT: Normocephalic, atraumatic. Sclera anicteric.  Chest: Clear to auscultation bilaterally. No wheezes, rhonchi, or rales. Cardiovascular: Regular rate and rhythm, no murmurs, rubs, or gallops.  Abdomen: Obese. Normoactive bowel sounds. Soft, nondistended, nontender to palpation. No masses or hepatosplenomegaly appreciated. No palpable fluid wave.  Well-healed laparoscopic incisions. Extremities: Grossly normal range of motion. Warm, well perfused. No edema bilaterally.  Skin: No rashes or lesions.  Lymphatics: No cervical, supraclavicular, or inguinal adenopathy.  GU:  Normal external female genitalia.   No lesions. No discharge or bleeding.             Bladder/urethra:  No lesions or masses, well supported bladder             Vagina: Mildly atrophic, no lesions noted.             Cervix: Normal appearing, no lesions.             Uterus: Enlarged uterus, approximately 10 cm, quite bulbous lower uterine segment with anteverted uterus, mobile.  There is some fullness appreciated just behind the pubic symphysis, although this area is difficult to palpate either intravaginally or with the abdominal hand.             Adnexa: No masses.  Rectal: Deferred.  LABORATORY AND RADIOLOGIC DATA:  Outside medical records were reviewed to synthesize the above history, along with the history and physical obtained during the visit.   Lab Results  Component Value Date   WBC 5.4 02/20/2014   HGB 11.8 (L) 02/20/2014   HCT 35.4 03/07/2019   PLT 243 02/20/2014   GLUCOSE 87 07/27/2019   ALT 20 07/27/2019   AST 15 07/27/2019   NA 142 07/27/2019   K 4.1 07/27/2019   CL 107 (H) 07/27/2019   CREATININE 0.76 07/27/2019   BUN 17 07/27/2019   CO2 24 07/27/2019   TSH 1.650 03/07/2019   HGBA1C 4.8  07/27/2019    

## 2022-05-15 ENCOUNTER — Inpatient Hospital Stay: Payer: Commercial Managed Care - PPO | Attending: Gynecologic Oncology | Admitting: Gynecologic Oncology

## 2022-05-15 ENCOUNTER — Inpatient Hospital Stay (HOSPITAL_BASED_OUTPATIENT_CLINIC_OR_DEPARTMENT_OTHER): Payer: Commercial Managed Care - PPO | Admitting: Gynecologic Oncology

## 2022-05-15 ENCOUNTER — Encounter: Payer: Self-pay | Admitting: Oncology

## 2022-05-15 ENCOUNTER — Encounter: Payer: Self-pay | Admitting: Gynecologic Oncology

## 2022-05-15 ENCOUNTER — Other Ambulatory Visit (HOSPITAL_BASED_OUTPATIENT_CLINIC_OR_DEPARTMENT_OTHER): Payer: Self-pay

## 2022-05-15 ENCOUNTER — Other Ambulatory Visit: Payer: Self-pay

## 2022-05-15 VITALS — BP 120/77 | HR 83 | Temp 97.8°F | Resp 20 | Wt 222.4 lb

## 2022-05-15 DIAGNOSIS — Z9889 Other specified postprocedural states: Secondary | ICD-10-CM | POA: Diagnosis not present

## 2022-05-15 DIAGNOSIS — R1900 Intra-abdominal and pelvic swelling, mass and lump, unspecified site: Secondary | ICD-10-CM

## 2022-05-15 DIAGNOSIS — Z780 Asymptomatic menopausal state: Secondary | ICD-10-CM | POA: Diagnosis not present

## 2022-05-15 DIAGNOSIS — Z6838 Body mass index (BMI) 38.0-38.9, adult: Secondary | ICD-10-CM | POA: Diagnosis not present

## 2022-05-15 DIAGNOSIS — K5900 Constipation, unspecified: Secondary | ICD-10-CM | POA: Diagnosis not present

## 2022-05-15 DIAGNOSIS — N979 Female infertility, unspecified: Secondary | ICD-10-CM | POA: Diagnosis not present

## 2022-05-15 DIAGNOSIS — Z8759 Personal history of other complications of pregnancy, childbirth and the puerperium: Secondary | ICD-10-CM | POA: Diagnosis not present

## 2022-05-15 DIAGNOSIS — N941 Unspecified dyspareunia: Secondary | ICD-10-CM | POA: Diagnosis not present

## 2022-05-15 DIAGNOSIS — E538 Deficiency of other specified B group vitamins: Secondary | ICD-10-CM | POA: Insufficient documentation

## 2022-05-15 DIAGNOSIS — R339 Retention of urine, unspecified: Secondary | ICD-10-CM

## 2022-05-15 DIAGNOSIS — B009 Herpesviral infection, unspecified: Secondary | ICD-10-CM | POA: Insufficient documentation

## 2022-05-15 DIAGNOSIS — E669 Obesity, unspecified: Secondary | ICD-10-CM

## 2022-05-15 DIAGNOSIS — D259 Leiomyoma of uterus, unspecified: Secondary | ICD-10-CM | POA: Diagnosis not present

## 2022-05-15 DIAGNOSIS — R32 Unspecified urinary incontinence: Secondary | ICD-10-CM

## 2022-05-15 MED ORDER — SENNOSIDES-DOCUSATE SODIUM 8.6-50 MG PO TABS
2.0000 | ORAL_TABLET | Freq: Every day | ORAL | 0 refills | Status: DC
  Filled 2022-05-15 – 2022-06-01 (×2): qty 100, 50d supply, fill #0

## 2022-05-15 MED ORDER — IBUPROFEN 800 MG PO TABS
800.0000 mg | ORAL_TABLET | Freq: Three times a day (TID) | ORAL | 0 refills | Status: DC | PRN
  Filled 2022-05-15 – 2022-06-01 (×2): qty 30, 10d supply, fill #0

## 2022-05-15 MED ORDER — TRAMADOL HCL 50 MG PO TABS
50.0000 mg | ORAL_TABLET | Freq: Four times a day (QID) | ORAL | 0 refills | Status: DC | PRN
  Filled 2022-05-15 – 2022-06-01 (×2): qty 10, 3d supply, fill #0

## 2022-05-15 NOTE — Progress Notes (Signed)
Met with Ling after her appointment with Dr. Berline Lopes. Reviewed pre op education and instructions.  Provided her with my contact information and encouraged her to call with any questions.

## 2022-05-15 NOTE — Progress Notes (Signed)
Patient here for a pre-operative appointment prior to her surgery. She is scheduled for a robotic assisted total laparoscopic hysterectomy, bilateral oophorectomy, unilateral salpingectomy, excision of pelvic mass, possible staging, possible laparotomy. The surgery was discussed in detail.  See after visit summary for additional details. Visual aids used to discuss items related to surgery including the incentive spirometer, sequential compression stockings, foley catheter, IV pump, multi-modal pain regimen including tylenol, photo of the surgical robot, female reproductive system to discuss surgery in detail.      Discussed post-op pain management in detail including the aspects of the enhanced recovery pathway.  Advised her that a new prescription would be sent in for Tramadol and it is only to be used for after her upcoming surgery.  We discussed the use of tylenol post-op and to monitor for a maximum of 4,000 mg in a 24 hour period.  Also prescribed sennakot to be used after surgery and to hold if having loose stools.  Discussed bowel regimen in detail.     Discussed the use of SCDs and measures to take at home to prevent DVT including frequent mobility.  Reportable signs and symptoms of DVT discussed. Post-operative instructions discussed and expectations for after surgery. Incisional care discussed as well including reportable signs and symptoms including erythema, drainage, wound separation.     30 minutes spent with the patient.  Verbalizing understanding of material discussed. No needs or concerns voiced at the end of the visit.   Advised patient to call for any needs.  Advised that her post-operative medications had been prescribed and could be picked up at any time.    This appointment is included in the global surgical bundle as pre-operative teaching and has no charge.

## 2022-05-15 NOTE — Patient Instructions (Signed)
Preparing for your Surgery  Potential surgery dates include Feb 14, Feb 15, Feb 21, Feb 28, March 20, March 21. Dr. Berline Lopes will reach out to Dr. Claudia Desanctis to see which dates she is available.  Plan for surgery with Dr. Jeral Pinch at [place] will be scheduled for robotic assisted total laparoscopic hysterectomy (removal of the uterus and cervix), bilateral oophorectomy (removal of both ovaries), unilateral salpingectomy (removal of remaining fallopian tube), excision of pelvic mass, possible staging, possible laparotomy (larger incision on your abdomen if needed).   Pre-operative Testing -You will receive a phone call from presurgical testing at [facility] to arrange for a pre-operative appointment and lab work.  -Bring your insurance card, copy of an advanced directive if applicable, medication list  -At that visit, you will be asked to sign a consent for a possible blood transfusion in case a transfusion becomes necessary during surgery.  The need for a blood transfusion is rare but having consent is a necessary part of your care.     -You should not be taking blood thinners or aspirin at least ten days prior to surgery unless instructed by your surgeon.  -Do not take supplements such as fish oil (omega 3), red yeast rice, turmeric before your surgery. You want to avoid medications with aspirin in them including headache powders such as BC or Goody's), Excedrin migraine.  Day Before Surgery at [place] will be asked to take in a light diet the day before surgery. You will be advised you can have clear liquids up until 3 hours before your surgery.    Eat a light diet the day before surgery.  Examples including soups, broths, toast, yogurt, mashed potatoes.  AVOID GAS PRODUCING FOODS AND BEVERAGES. Things to avoid include carbonated beverages (fizzy beverages, sodas), raw fruits and raw vegetables (uncooked), or beans.   If your bowels are filled with gas, your  surgeon will have difficulty visualizing your pelvic organs which increases your surgical risks.  Your role in recovery Your role is to become active as soon as directed by your doctor, while still giving yourself time to heal.  Rest when you feel tired. You will be asked to do the following in order to speed your recovery:  - Cough and breathe deeply. This helps to clear and expand your lungs and can prevent pneumonia after surgery.  - [place]. Do mild physical activity. Walking or moving your legs help your circulation and body functions return to normal. Do not try to get up or walk alone the first time after surgery.   -If you develop swelling on one leg or the other, pain in the back of your leg, redness/warmth in one of your legs, please call the office or go to the Emergency Room to have a doppler to rule out a blood clot. For shortness of breath, chest pain-seek care in the Emergency Room as soon as possible. - Actively manage your pain. Managing your pain lets you move in comfort. We will ask you to rate your pain on a scale of zero to 10. It is your responsibility to tell your doctor or nurse where and how much you hurt so your pain can be treated.  Special Considerations -If you are diabetic, you may be placed on insulin after surgery to have closer control over your blood sugars to promote healing and recovery.  This does not mean that you will be discharged on insulin.  If applicable,  your oral antidiabetics will be resumed when you are tolerating a solid diet.  -Your final pathology results from surgery should be available around one week after surgery and the results will be relayed to you when available.  -FMLA forms can be faxed to [phone] and please allow 5-7 business days for completion.  Pain Management After Surgery -You have been prescribed your pain medication and bowel regimen medications before surgery so that you can have these available when you  are discharged from the hospital. The pain medication is for use ONLY AFTER surgery and a new prescription will not be given.   -Make sure that you have Tylenol and Ibuprofen IF YOU ARE ABLE TO TAKE THESE MEDICATIONS at home to use on a regular basis after surgery for pain control. We recommend alternating the medications every hour to six hours since they work differently and are processed in the body differently for pain relief.  -Review the attached handout on narcotic use and their risks and side effects.   Bowel Regimen -You have been prescribed Sennakot-S to take nightly to prevent constipation especially if you are taking the narcotic pain medication intermittently.  It is important to prevent constipation and drink adequate amounts of liquids. You can stop taking this medication when you are not taking pain medication and you are back on your normal bowel routine.  Risks of Surgery Risks of surgery are low but include bleeding, infection, damage to surrounding structures, re-operation, blood clots, and very rarely death.   Blood Transfusion Information (For the consent to be signed before surgery)  We will be checking your blood type before surgery so in case of emergencies, we will know what type of blood you would need.                                            WHAT IS A BLOOD TRANSFUSION?  A transfusion is the replacement of blood or some of its parts. Blood is made up of multiple cells which provide different functions. Red blood cells carry oxygen and are used for blood loss replacement. White blood cells fight against infection. Platelets control bleeding. Plasma helps clot blood. Other blood products are available for specialized needs, such as hemophilia or other clotting disorders. BEFORE THE TRANSFUSION  Who gives blood for transfusions?  You may be able to donate blood to be used at a later date on yourself (autologous donation). Relatives can be asked to donate blood.  This is generally not any safer than if you have received blood from a stranger. The same precautions are taken to ensure safety when a relative's blood is donated. Healthy volunteers who are fully evaluated to make sure their blood is safe. This is blood bank blood. Transfusion therapy is the safest it has ever been in the practice of medicine. Before blood is taken from a donor, a complete history is taken to make sure that person has no history of diseases nor engages in risky social behavior (examples are intravenous drug use or sexual activity with multiple partners). The donor's travel history is screened to minimize risk of transmitting infections, such as malaria. The donated blood is tested for signs of infectious diseases, such as HIV and hepatitis. The blood is then tested to be sure it is compatible with you in order to minimize the chance of a transfusion reaction. If you or a  relative donates blood, this is often done in anticipation of surgery and is not appropriate for emergency situations. It takes many days to process the donated blood. RISKS AND COMPLICATIONS Although transfusion therapy is very safe and saves many lives, the main dangers of transfusion include:  Getting an infectious disease. Developing a transfusion reaction. This is an allergic reaction to something in the blood you were given. Every precaution is taken to prevent this. The decision to have a blood transfusion has been considered carefully by your caregiver before blood is given. Blood is not given unless the benefits outweigh the risks.  AFTER SURGERY INSTRUCTIONS  Return to work: 4-6 weeks if applicable  Activity: 1. Be up and out of the bed during the day.  Take a nap if needed.  You may walk up steps but be careful and use the hand rail.  Stair climbing will tire you more than you think, you may need to stop part way and rest.   2. No lifting or straining for 6 weeks over 10 pounds. No pushing, pulling,  straining for 6 weeks.  3. No driving for around 1 week(s).  Do not drive if you are taking narcotic pain medicine and make sure that your reaction time has returned.   4. You can shower as soon as the next day after surgery. Shower daily.  Use your regular soap and water (not directly on the incision) and pat your incision(s) dry afterwards; don't rub.  No tub baths or submerging your body in water until cleared by your surgeon. If you have the soap that was given to you by pre-surgical testing that was used before surgery, you do not need to use it afterwards because this can irritate your incisions.   5. No sexual activity and nothing in the vagina for 10-12 weeks.  6. You may experience a small amount of clear drainage from your incisions, which is normal.  If the drainage persists, increases, or changes color please call the office.  7. Do not use creams, lotions, or ointments such as neosporin on your incisions after surgery until advised by your surgeon because they can cause removal of the dermabond glue on your incisions.    8. You may experience vaginal spotting after surgery or around the 6-8 week mark from surgery when the stitches at the top of the vagina begin to dissolve.  The spotting is normal but if you experience heavy bleeding, call our office.  9. Take Tylenol or ibuprofen first for pain if you are able to take these medications and only use narcotic pain medication for severe pain not relieved by the Tylenol or Ibuprofen.  Monitor your Tylenol intake to a max of 4,000 mg in a 24 hour period. You can alternate these medications after surgery.  Diet: 1. Low sodium Heart Healthy Diet is recommended but you are cleared to resume your normal (before surgery) diet after your procedure.  2. It is safe to use a laxative, such as Miralax or Colace, if you have difficulty moving your bowels. You have been prescribed Sennakot-S to take at bedtime every evening after surgery to keep  bowel movements regular and to prevent constipation.    Wound Care: 1. Keep clean and dry.  Shower daily.  Reasons to call the Doctor: Fever - Oral temperature greater than 100.4 degrees Fahrenheit Foul-smelling vaginal discharge Difficulty urinating Nausea and vomiting Increased pain at the site of the incision that is unrelieved with pain medicine. Difficulty breathing with or  without chest pain New calf pain especially if only on one side Sudden, continuing increased vaginal bleeding with or without clots.   Contacts: For questions or concerns you should contact:  Dr. Jeral Pinch at [phone]  Joylene John, NP at [phone]  After Hours: call [phone] and have the GYN Oncologist paged/contacted (after 5 pm or on the weekends).  Messages sent via mychart are for non-urgent matters and are not responded to after hours so for urgent needs, please call the after hours number.

## 2022-05-19 ENCOUNTER — Telehealth: Payer: Self-pay

## 2022-05-19 NOTE — Telephone Encounter (Signed)
Per Joylene John NP, Sarah Villa is aware she has been scheduled for surgery on March 20. Dr. Claudia Desanctis has been notified of either March 20 or March 21st and once she confirms which date, pt will be notified. Pt voiced an understanding and will wait for a call.

## 2022-05-19 NOTE — Telephone Encounter (Signed)
Records received from [facility] per Dr. Lulu Riding request.  Dr. Berline Lopes notified and records placed for her review.

## 2022-05-20 ENCOUNTER — Other Ambulatory Visit: Payer: Self-pay | Admitting: Gynecologic Oncology

## 2022-05-20 DIAGNOSIS — R1900 Intra-abdominal and pelvic swelling, mass and lump, unspecified site: Secondary | ICD-10-CM

## 2022-05-20 DIAGNOSIS — D259 Leiomyoma of uterus, unspecified: Secondary | ICD-10-CM

## 2022-05-21 DIAGNOSIS — D509 Iron deficiency anemia, unspecified: Secondary | ICD-10-CM | POA: Diagnosis not present

## 2022-05-27 ENCOUNTER — Other Ambulatory Visit (HOSPITAL_BASED_OUTPATIENT_CLINIC_OR_DEPARTMENT_OTHER): Payer: Self-pay

## 2022-06-01 ENCOUNTER — Other Ambulatory Visit (HOSPITAL_BASED_OUTPATIENT_CLINIC_OR_DEPARTMENT_OTHER): Payer: Self-pay

## 2022-06-15 NOTE — Progress Notes (Addendum)
COVID Vaccine Completed:  Yes  Date of COVID positive in last 90 days:  No  PCP - Harlan Stains, MD Cardiologist - Mertie Moores, MD (OV 2016)  Chest x-ray - N/A EKG - N/A Stress Test - N/A ECHO - 11-05-14 Epic Cardiac Cath - N/A Pacemaker/ICD device last checked: Spinal Cord Stimulator:N/A  Bowel Prep - N/A  Sleep Study - N/A CPAP -   Fasting Blood Sugar - N/A Checks Blood Sugar _____ times a day  Last dose of GLP1 agonist-  N/A GLP1 instructions:  N/A   Last dose of SGLT-2 inhibitors-  N/A SGLT-2 instructions: N/A   Blood Thinner Instructions:N/A Aspirin Instructions: Last Dose:  Activity level:  Can go up a flight of stairs and perform activities of daily living without stopping and without symptoms of chest pain or shortness of breath.  Anesthesia review:  Systolic murmur  Patient denies shortness of breath, fever, cough and chest pain at PAT appointment  Patient verbalized understanding of instructions that were given to them at the PAT appointment. Patient was also instructed that they will need to review over the PAT instructions again at home before surgery.

## 2022-06-15 NOTE — Patient Instructions (Addendum)
SURGICAL WAITING ROOM VISITATION Patients having surgery or a procedure may have no more than 2 support people in the waiting area - these visitors may rotate.    If the patient needs to stay at the hospital during part of their recovery, the visitor guidelines for inpatient rooms apply. Pre-op nurse will coordinate an appropriate time for 1 support person to accompany patient in pre-op.  This support person may not rotate.    Please refer to the [facility] website for the visitor guidelines for Inpatients (after your surgery is over and you are in a regular room).   Due to an increase in RSV and influenza rates and associated hospitalizations, children ages 57 and under may not visit patients in [place].     Your procedure is scheduled on: 07-01-22   Report to [facility] Main Entrance    Report to admitting at 6:15 AM   Call this number if you have problems the morning of surgery [phone]    Follow a light diet the day before surgery (avoid gas producing foods)   Do not eat food :After Midnight.   After Midnight you may have the following liquids until 5:30 AM/ DAY OF SURGERY  Water Non-Citrus Juices (without pulp, NO RED) Carbonated Beverages Black Coffee (NO MILK/CREAM OR CREAMERS, sugar ok)  Clear Tea (NO MILK/CREAM OR CREAMERS, sugar ok) regular and decaf                             Plain Jell-O (NO RED)                                           Fruit ices (not with fruit pulp, NO RED)                                     Popsicles (NO RED)                                                               Sports drinks like Gatorade (NO RED)                   If you have questions, please contact your surgeon's office.   FOLLOW  ANY ADDITIONAL PRE OP INSTRUCTIONS YOU RECEIVED FROM YOUR SURGEON'S OFFICE!!!     Oral Hygiene is also important to reduce your risk of infection.                                    Remember - BRUSH YOUR TEETH THE MORNING  OF SURGERY WITH YOUR REGULAR TOOTHPASTE   Do NOT smoke after Midnight   Take these medicines the morning of surgery with A SIP OF WATER:   Zyrtec                              You may not have any metal on your body including hair pins, jewelry,  and body piercing             Do not wear make-up, lotions, powders, perfumes or deodorant  Do not wear nail polish including gel and S&S, artificial/acrylic nails, or any other type of covering on natural nails including finger and toenails. If you have artificial nails, gel coating, etc. that needs to be removed by a nail salon please have this removed prior to surgery or surgery may need to be canceled/ delayed if the surgeon/ anesthesia feels like they are unable to be safely monitored.   Do not shave  48 hours prior to surgery.    Do not bring valuables to the hospital. [place].   Contacts, dentures or bridgework may not be worn into surgery.  DO NOT [place]. PHARMACY WILL DISPENSE MEDICATIONS LISTED ON YOUR MEDICATION LIST TO YOU DURING YOUR ADMISSION [place]!    Patients discharged on the day of surgery will not be allowed to drive home.  Someone NEEDS to stay with you for the first 24 hours after anesthesia.              Please read over the following fact sheets you were given: IF [facility]   If you received a COVID test during your pre-op visit  it is requested that you wear a mask when out in public, stay away from anyone that may not be feeling well and notify your surgeon if you develop symptoms. If you test positive for Covid or have been in contact with anyone that has tested positive in the last 10 days please notify you surgeon.  [place] - Preparing for Surgery Before surgery, you can play an important role.  Because skin is not sterile, your skin needs to be as free of germs  as possible.  You can reduce the number of germs on your skin by washing with CHG (chlorahexidine gluconate) soap before surgery.  CHG is an antiseptic cleaner which kills germs and bonds with the skin to continue killing germs even after washing. Please DO NOT use if you have an allergy to CHG or antibacterial soaps.  If your skin becomes reddened/irritated stop using the CHG and inform your nurse when you arrive at Short Stay. Do not shave (including legs and underarms) for at least 48 hours prior to the first CHG shower.  You may shave your face/neck.  Please follow these instructions carefully:  1.  Shower with CHG Soap the night before surgery and the  morning of surgery.  2.  If you choose to wash your hair, wash your hair first as usual with your normal  shampoo.  3.  After you shampoo, rinse your hair and body thoroughly to remove the shampoo.                             4.  Use CHG as you would any other liquid soap.  You can apply chg directly to the skin and wash.  Gently with a scrungie or clean washcloth.  5.  Apply the CHG Soap to your body ONLY FROM THE NECK DOWN.   Do   not use on face/ open                           Wound  or open sores. Avoid contact with eyes, ears mouth and   genitals (private parts).                       Wash face,  Genitals (private parts) with your normal soap.             6.  Wash thoroughly, paying special attention to the area where your    surgery  will be performed.  7.  Thoroughly rinse your body with warm water from the neck down.  8.  DO NOT shower/wash with your normal soap after using and rinsing off the CHG Soap.                9.  Pat yourself dry with a clean towel.            10.  Wear clean pajamas.            11.  Place clean sheets on your bed the night of your first shower and do not  sleep with pets. Day of Surgery : Do not apply any lotions/deodorants the morning of surgery.  Please wear clean clothes to the hospital/surgery  center.  FAILURE TO FOLLOW THESE INSTRUCTIONS MAY RESULT IN THE CANCELLATION OF YOUR SURGERY  PATIENT SIGNATURE_________________________________  NURSE SIGNATURE__________________________________  ________________________________________________________________________   WHAT IS A BLOOD TRANSFUSION? Blood Transfusion Information  A transfusion is the replacement of blood or some of its parts. Blood is made up of multiple cells which provide different functions. Red blood cells carry oxygen and are used for blood loss replacement. White blood cells fight against infection. Platelets control bleeding. Plasma helps clot blood. Other blood products are available for specialized needs, such as hemophilia or other clotting disorders. BEFORE THE TRANSFUSION  Who gives blood for transfusions?  Healthy volunteers who are fully evaluated to make sure their blood is safe. This is blood bank blood. Transfusion therapy is the safest it has ever been in the practice of medicine. Before blood is taken from a donor, a complete history is taken to make sure that person has no history of diseases nor engages in risky social behavior (examples are intravenous drug use or sexual activity with multiple partners). The donor's travel history is screened to minimize risk of transmitting infections, such as malaria. The donated blood is tested for signs of infectious diseases, such as HIV and hepatitis. The blood is then tested to be sure it is compatible with you in order to minimize the chance of a transfusion reaction. If you or a relative donates blood, this is often done in anticipation of surgery and is not appropriate for emergency situations. It takes many days to process the donated blood. RISKS AND COMPLICATIONS Although transfusion therapy is very safe and saves many lives, the main dangers of transfusion include:  Getting an infectious disease. Developing a transfusion reaction. This is an allergic  reaction to something in the blood you were given. Every precaution is taken to prevent this. The decision to have a blood transfusion has been considered carefully by your caregiver before blood is given. Blood is not given unless the benefits outweigh the risks. AFTER THE TRANSFUSION Right after receiving a blood transfusion, you will usually feel much better and more energetic. This is especially true if your red blood cells have gotten low (anemic). The transfusion raises the level of the red blood cells which carry oxygen, and this usually causes an energy increase. The nurse administering the transfusion  will monitor you carefully for complications. HOME CARE INSTRUCTIONS  No special instructions are needed after a transfusion. You may find your energy is better. Speak with your caregiver about any limitations on activity for underlying diseases you may have. SEEK MEDICAL CARE IF:  Your condition is not improving after your transfusion. You develop redness or irritation at the intravenous (IV) site. SEEK IMMEDIATE MEDICAL CARE IF:  Any of the following symptoms occur over the next 12 hours: Shaking chills. You have a temperature by mouth above 102 F (38.9 C), not controlled by medicine. Chest, back, or muscle pain. People around you feel you are not acting correctly or are confused. Shortness of breath or difficulty breathing. Dizziness and fainting. You get a rash or develop hives. You have a decrease in urine output. Your urine turns a dark color or changes to pink, red, or brown. Any of the following symptoms occur over the next 10 days: You have a temperature by mouth above 102 F (38.9 C), not controlled by medicine. Shortness of breath. Weakness after normal activity. The white part of the eye turns yellow (jaundice). You have a decrease in the amount of urine or are urinating less often. Your urine turns a dark color or changes to pink, red, or brown. Document Released:  03/27/2000 Document Revised: 06/22/2011 Document Reviewed: 11/14/2007 [facility] Patient Information 2014 [place], [place].  _______________________________________________________________________

## 2022-06-16 ENCOUNTER — Encounter: Payer: Self-pay | Admitting: Gynecologic Oncology

## 2022-06-19 ENCOUNTER — Encounter (HOSPITAL_COMMUNITY)
Admission: RE | Admit: 2022-06-19 | Discharge: 2022-06-19 | Disposition: A | Discharge status: Home / Self Care | Payer: Commercial Managed Care - PPO | Source: Ambulatory Visit | Attending: Gynecologic Oncology | Admitting: Gynecologic Oncology

## 2022-06-19 ENCOUNTER — Other Ambulatory Visit: Payer: Self-pay

## 2022-06-19 ENCOUNTER — Encounter (HOSPITAL_COMMUNITY): Payer: Self-pay

## 2022-06-19 DIAGNOSIS — D250 Submucous leiomyoma of uterus: Secondary | ICD-10-CM | POA: Insufficient documentation

## 2022-06-19 DIAGNOSIS — Z01812 Encounter for preprocedural laboratory examination: Secondary | ICD-10-CM | POA: Diagnosis not present

## 2022-06-19 DIAGNOSIS — R1900 Intra-abdominal and pelvic swelling, mass and lump, unspecified site: Secondary | ICD-10-CM | POA: Insufficient documentation

## 2022-06-19 HISTORY — DX: Family history of other specified conditions: Z84.89

## 2022-06-19 HISTORY — DX: Cardiac murmur, unspecified: R01.1

## 2022-06-19 LAB — COMPREHENSIVE METABOLIC PANEL
ALT: 19 U/L (ref 0–44)
AST: 17 U/L (ref 15–41)
Albumin: 4 g/dL (ref 3.5–5.0)
Alkaline Phosphatase: 63 U/L (ref 38–126)
Anion gap: 7 (ref 5–15)
BUN: 15 mg/dL (ref 6–20)
CO2: 26 mmol/L (ref 22–32)
Calcium: 9 mg/dL (ref 8.9–10.3)
Chloride: 107 mmol/L (ref 98–111)
Creatinine, Ser: 0.69 mg/dL (ref 0.44–1.00)
GFR, Estimated: >60 mL/min (ref >60)
Glucose, Bld: 89 mg/dL (ref 70–99)
Potassium: 4.1 mmol/L (ref 3.5–5.1)
Sodium: 140 mmol/L (ref 135–145)
Total Bilirubin: 0.6 mg/dL (ref 0.3–1.2)
Total Protein: 6.9 g/dL (ref 6.5–8.1)

## 2022-06-19 LAB — CBC
HCT: 37.8 % (ref 36.0–46.0)
Hemoglobin: 11.6 g/dL — ABNORMAL LOW (ref 12.0–15.0)
MCH: 28.3 pg (ref 26.0–34.0)
MCHC: 30.7 g/dL (ref 30.0–36.0)
MCV: 92.2 fL (ref 80.0–100.0)
Platelets: 231 10*3/uL (ref 150–400)
RBC: 4.1 MIL/uL (ref 3.87–5.11)
RDW: 14.2 % (ref 11.5–15.5)
WBC: 5.5 10*3/uL (ref 4.0–10.5)
nRBC: 0 % (ref 0.0–0.2)

## 2022-06-19 LAB — TYPE AND SCREEN
ABO/RH(D): A POS
Antibody Screen: NEGATIVE

## 2022-06-24 ENCOUNTER — Telehealth: Payer: Self-pay | Admitting: *Deleted

## 2022-06-24 NOTE — Telephone Encounter (Signed)
Fax FMLA paperwork  

## 2022-06-30 ENCOUNTER — Telehealth: Payer: Self-pay

## 2022-06-30 NOTE — Telephone Encounter (Signed)
Telephone call to check on pre-operative status.  Patient compliant with pre-operative instructions.  Reinforced nothing to eat after midnight. Clear liquids until 0515. Patient to arrive at 0615.  No questions or concerns voiced.  Instructed to call for any needs.  ?

## 2022-06-30 NOTE — Anesthesia Preprocedure Evaluation (Addendum)
Anesthesia Evaluation  Patient identified by MRN, date of birth, ID band Patient awake    Reviewed: Allergy & Precautions, NPO status , Patient's Chart, lab work & pertinent test results  Airway Mallampati: III  TM Distance: >3 FB Neck ROM: Full    Dental no notable dental hx. (+) Teeth Intact, Dental Advisory Given   Pulmonary  Snores at night   Pulmonary exam normal breath sounds clear to auscultation       Cardiovascular negative cardio ROS Normal cardiovascular exam Rhythm:Regular Rate:Normal     Neuro/Psych  PSYCHIATRIC DISORDERS  Depression    negative neurological ROS     GI/Hepatic negative GI ROS, Neg liver ROS,,,  Endo/Other  negative endocrine ROS    Renal/GU negative Renal ROS  negative genitourinary   Musculoskeletal negative musculoskeletal ROS (+)    Abdominal  (+) + obese  Peds  Hematology  (+) Blood dyscrasia, anemia Hb 11.6, ptl 231   Anesthesia Other Findings   Reproductive/Obstetrics negative OB ROS                             Anesthesia Physical Anesthesia Plan  ASA: 2  Anesthesia Plan: General   Post-op Pain Management: Tylenol PO (pre-op)*, Toradol IV (intra-op)* and Ketamine IV*   Induction: Intravenous  PONV Risk Score and Plan: 4 or greater and Ondansetron, Dexamethasone, Midazolam and Treatment may vary due to age or medical condition  Airway Management Planned: Oral ETT  Additional Equipment: None  Intra-op Plan:   Post-operative Plan: Extubation in OR  Informed Consent: I have reviewed the patients History and Physical, chart, labs and discussed the procedure including the risks, benefits and alternatives for the proposed anesthesia with the patient or authorized representative who has indicated his/her understanding and acceptance.     Dental advisory given  Plan Discussed with: CRNA  Anesthesia Plan Comments:        Anesthesia Quick  Evaluation

## 2022-07-01 ENCOUNTER — Ambulatory Visit (HOSPITAL_COMMUNITY): Payer: Commercial Managed Care - PPO | Admitting: Physician Assistant

## 2022-07-01 ENCOUNTER — Encounter (HOSPITAL_COMMUNITY)
Admission: RE | Disposition: A | Discharge status: Home / Self Care | Payer: Self-pay | Source: Ambulatory Visit | Attending: Gynecologic Oncology

## 2022-07-01 ENCOUNTER — Ambulatory Visit (HOSPITAL_BASED_OUTPATIENT_CLINIC_OR_DEPARTMENT_OTHER): Payer: Commercial Managed Care - PPO | Admitting: Anesthesiology

## 2022-07-01 ENCOUNTER — Other Ambulatory Visit: Payer: Self-pay

## 2022-07-01 ENCOUNTER — Encounter (HOSPITAL_COMMUNITY): Payer: Self-pay | Admitting: Gynecologic Oncology

## 2022-07-01 ENCOUNTER — Ambulatory Visit (HOSPITAL_COMMUNITY)
Admission: RE | Admit: 2022-07-01 | Discharge: 2022-07-01 | Disposition: A | Discharge status: Home / Self Care | Payer: Commercial Managed Care - PPO | Source: Ambulatory Visit | Attending: Gynecologic Oncology | Admitting: Gynecologic Oncology

## 2022-07-01 DIAGNOSIS — N3289 Other specified disorders of bladder: Secondary | ICD-10-CM

## 2022-07-01 DIAGNOSIS — N888 Other specified noninflammatory disorders of cervix uteri: Secondary | ICD-10-CM | POA: Insufficient documentation

## 2022-07-01 DIAGNOSIS — R1900 Intra-abdominal and pelvic swelling, mass and lump, unspecified site: Secondary | ICD-10-CM | POA: Insufficient documentation

## 2022-07-01 DIAGNOSIS — Z6838 Body mass index (BMI) 38.0-38.9, adult: Secondary | ICD-10-CM | POA: Insufficient documentation

## 2022-07-01 DIAGNOSIS — D259 Leiomyoma of uterus, unspecified: Secondary | ICD-10-CM

## 2022-07-01 DIAGNOSIS — E669 Obesity, unspecified: Secondary | ICD-10-CM | POA: Insufficient documentation

## 2022-07-01 DIAGNOSIS — D250 Submucous leiomyoma of uterus: Secondary | ICD-10-CM

## 2022-07-01 DIAGNOSIS — D630 Anemia in neoplastic disease: Secondary | ICD-10-CM | POA: Diagnosis not present

## 2022-07-01 HISTORY — PX: ROBOTIC ASSISTED TOTAL HYSTERECTOMY WITH BILATERAL SALPINGO OOPHERECTOMY: SHX6086

## 2022-07-01 HISTORY — PX: LAPAROTOMY WITH STAGING: SHX5938

## 2022-07-01 SURGERY — HYSTERECTOMY, TOTAL, ROBOT-ASSISTED, LAPAROSCOPIC, WITH BILATERAL SALPINGO-OOPHORECTOMY
Anesthesia: General | Site: Abdomen

## 2022-07-01 MED ORDER — OXYCODONE HCL 5 MG/5ML PO SOLN: 5.0000 mg | Freq: Once | ORAL | Status: AC | PRN

## 2022-07-01 MED ORDER — DEXAMETHASONE SODIUM PHOSPHATE 4 MG/ML IJ SOLN
4.0000 mg | INTRAMUSCULAR | Status: AC
  Administered 2022-07-01: 10 mg via INTRAVENOUS

## 2022-07-01 MED ORDER — HYDROMORPHONE HCL 1 MG/ML IJ SOLN
INTRAMUSCULAR | Status: AC
  Filled 2022-07-01: qty 1

## 2022-07-01 MED ORDER — LIDOCAINE HCL (CARDIAC) PF 100 MG/5ML IV SOSY
PREFILLED_SYRINGE | INTRAVENOUS | Status: DC | PRN
  Administered 2022-07-01: 60 mg via INTRAVENOUS

## 2022-07-01 MED ORDER — CHLORHEXIDINE GLUCONATE 0.12 % MT SOLN
15.0000 mL | Freq: Once | OROMUCOSAL | Status: AC
  Administered 2022-07-01: 15 mL via OROMUCOSAL

## 2022-07-01 MED ORDER — LACTATED RINGERS IV SOLN: INTRAVENOUS | Status: DC | PRN

## 2022-07-01 MED ORDER — PROPOFOL 10 MG/ML IV BOLUS
INTRAVENOUS | Status: AC
  Filled 2022-07-01: qty 20

## 2022-07-01 MED ORDER — ONDANSETRON HCL 4 MG/2ML IJ SOLN
INTRAMUSCULAR | Status: AC
  Filled 2022-07-01: qty 2

## 2022-07-01 MED ORDER — OXYCODONE HCL 5 MG PO TABS
5.0000 mg | ORAL_TABLET | Freq: Once | ORAL | Status: AC | PRN
  Administered 2022-07-01: 5 mg via ORAL

## 2022-07-01 MED ORDER — HEPARIN SODIUM (PORCINE) 5000 UNIT/ML IJ SOLN
5000.0000 [IU] | INTRAMUSCULAR | Status: AC
  Administered 2022-07-01: 5000 [IU] via SUBCUTANEOUS
  Filled 2022-07-01: qty 1

## 2022-07-01 MED ORDER — GABAPENTIN 300 MG PO CAPS
300.0000 mg | ORAL_CAPSULE | ORAL | Status: AC
  Administered 2022-07-01: 300 mg via ORAL
  Filled 2022-07-01: qty 1

## 2022-07-01 MED ORDER — LACTATED RINGERS IV SOLN
INTRAVENOUS | Status: DC
  Administered 2022-07-01: 1000 mL via INTRAVENOUS

## 2022-07-01 MED ORDER — ESMOLOL HCL 100 MG/10ML IV SOLN
INTRAVENOUS | Status: DC | PRN
  Administered 2022-07-01: 20 mg via INTRAVENOUS

## 2022-07-01 MED ORDER — SCOPOLAMINE 1 MG/3DAYS TD PT72
1.0000 | MEDICATED_PATCH | TRANSDERMAL | Status: DC
  Administered 2022-07-01: 1.5 mg via TRANSDERMAL
  Filled 2022-07-01: qty 1

## 2022-07-01 MED ORDER — SODIUM CHLORIDE (PF) 0.9 % IJ SOLN
INTRAMUSCULAR | Status: AC
  Filled 2022-07-01: qty 20

## 2022-07-01 MED ORDER — MIDAZOLAM HCL 5 MG/5ML IJ SOLN
INTRAMUSCULAR | Status: DC | PRN
  Administered 2022-07-01: 2 mg via INTRAVENOUS

## 2022-07-01 MED ORDER — HYDROMORPHONE HCL 1 MG/ML IJ SOLN
0.2500 mg | INTRAMUSCULAR | Status: DC | PRN
  Administered 2022-07-01: 0.25 mg via INTRAVENOUS
  Administered 2022-07-01: 0.5 mg via INTRAVENOUS
  Administered 2022-07-01: 0.25 mg via INTRAVENOUS
  Administered 2022-07-01 (×2): 0.5 mg via INTRAVENOUS

## 2022-07-01 MED ORDER — DEXAMETHASONE SODIUM PHOSPHATE 10 MG/ML IJ SOLN
INTRAMUSCULAR | Status: AC
  Filled 2022-07-01: qty 1

## 2022-07-01 MED ORDER — METRONIDAZOLE 500 MG/100ML IV SOLN: 500.0000 mg | Freq: Two times a day (BID) | INTRAVENOUS | Status: DC

## 2022-07-01 MED ORDER — KETAMINE HCL 50 MG/5ML IJ SOSY
PREFILLED_SYRINGE | INTRAMUSCULAR | Status: AC
  Filled 2022-07-01: qty 5

## 2022-07-01 MED ORDER — METRONIDAZOLE 500 MG/100ML IV SOLN
500.0000 mg | Freq: Once | INTRAVENOUS | Status: DC
  Filled 2022-07-01: qty 100

## 2022-07-01 MED ORDER — PHENYLEPHRINE HCL (PRESSORS) 10 MG/ML IV SOLN
INTRAVENOUS | Status: AC
  Filled 2022-07-01: qty 1

## 2022-07-01 MED ORDER — BUPIVACAINE LIPOSOME 1.3 % IJ SUSP
INTRAMUSCULAR | Status: AC
  Filled 2022-07-01: qty 20

## 2022-07-01 MED ORDER — FENTANYL CITRATE (PF) 100 MCG/2ML IJ SOLN
INTRAMUSCULAR | Status: AC
  Filled 2022-07-01: qty 2

## 2022-07-01 MED ORDER — SUGAMMADEX SODIUM 200 MG/2ML IV SOLN
INTRAVENOUS | Status: DC | PRN
  Administered 2022-07-01: 200 mg via INTRAVENOUS

## 2022-07-01 MED ORDER — STERILE WATER FOR IRRIGATION IR SOLN
Status: DC | PRN
  Administered 2022-07-01: 1000 mL

## 2022-07-01 MED ORDER — HYDRALAZINE HCL 20 MG/ML IJ SOLN
INTRAMUSCULAR | Status: DC | PRN
  Administered 2022-07-01: 2.5 mg via INTRAVENOUS

## 2022-07-01 MED ORDER — MIDAZOLAM HCL 2 MG/2ML IJ SOLN
INTRAMUSCULAR | Status: AC
  Filled 2022-07-01: qty 2

## 2022-07-01 MED ORDER — ONDANSETRON HCL 4 MG/2ML IJ SOLN: 4.0000 mg | Freq: Once | INTRAMUSCULAR | Status: AC | PRN

## 2022-07-01 MED ORDER — KETOROLAC TROMETHAMINE 30 MG/ML IJ SOLN
INTRAMUSCULAR | Status: AC
  Filled 2022-07-01: qty 1

## 2022-07-01 MED ORDER — OXYCODONE HCL 5 MG PO TABS
ORAL_TABLET | ORAL | Status: AC
  Filled 2022-07-01: qty 1

## 2022-07-01 MED ORDER — AMISULPRIDE (ANTIEMETIC) 5 MG/2ML IV SOLN: 10.0000 mg | Freq: Once | INTRAVENOUS | Status: DC | PRN

## 2022-07-01 MED ORDER — KETAMINE HCL 10 MG/ML IJ SOLN
INTRAMUSCULAR | Status: DC | PRN
  Administered 2022-07-01: 40 mg via INTRAVENOUS

## 2022-07-01 MED ORDER — LACTATED RINGERS IR SOLN
Status: DC | PRN
  Administered 2022-07-01: 1000 mL

## 2022-07-01 MED ORDER — LIDOCAINE HCL (PF) 2 % IJ SOLN
INTRAMUSCULAR | Status: AC
  Filled 2022-07-01: qty 5

## 2022-07-01 MED ORDER — MEPERIDINE HCL 50 MG/ML IJ SOLN: 6.2500 mg | INTRAMUSCULAR | Status: DC | PRN

## 2022-07-01 MED ORDER — ESMOLOL HCL 100 MG/10ML IV SOLN
INTRAVENOUS | Status: AC
  Filled 2022-07-01: qty 10

## 2022-07-01 MED ORDER — ACETAMINOPHEN 10 MG/ML IV SOLN
INTRAVENOUS | Status: AC
  Filled 2022-07-01: qty 100

## 2022-07-01 MED ORDER — BUPIVACAINE HCL 0.25 % IJ SOLN
INTRAMUSCULAR | Status: DC | PRN
  Administered 2022-07-01: 50 mL

## 2022-07-01 MED ORDER — ROCURONIUM BROMIDE 10 MG/ML (PF) SYRINGE
PREFILLED_SYRINGE | INTRAVENOUS | Status: AC
  Filled 2022-07-01: qty 10

## 2022-07-01 MED ORDER — ORAL CARE MOUTH RINSE: 15.0000 mL | Freq: Once | OROMUCOSAL | Status: AC

## 2022-07-01 MED ORDER — KETOROLAC TROMETHAMINE 15 MG/ML IJ SOLN: 15.0000 mg | INTRAMUSCULAR | Status: DC

## 2022-07-01 MED ORDER — ACETAMINOPHEN 500 MG PO TABS
1000.0000 mg | ORAL_TABLET | ORAL | Status: AC
  Administered 2022-07-01: 1000 mg via ORAL
  Filled 2022-07-01: qty 2

## 2022-07-01 MED ORDER — BUPIVACAINE LIPOSOME 1.3 % IJ SUSP
INTRAMUSCULAR | Status: DC | PRN
  Administered 2022-07-01: 20 mL

## 2022-07-01 MED ORDER — KETOROLAC TROMETHAMINE 30 MG/ML IJ SOLN
30.0000 mg | Freq: Once | INTRAMUSCULAR | Status: AC | PRN
  Administered 2022-07-01: 30 mg via INTRAVENOUS

## 2022-07-01 MED ORDER — PROPOFOL 10 MG/ML IV BOLUS
INTRAVENOUS | Status: DC | PRN
  Administered 2022-07-01: 20 mg via INTRAVENOUS
  Administered 2022-07-01: 200 mg via INTRAVENOUS

## 2022-07-01 MED ORDER — PHENYLEPHRINE HCL (PRESSORS) 10 MG/ML IV SOLN
INTRAVENOUS | Status: DC | PRN
  Administered 2022-07-01: 80 ug via INTRAVENOUS
  Administered 2022-07-01: 100 ug via INTRAVENOUS

## 2022-07-01 MED ORDER — CEFAZOLIN SODIUM-DEXTROSE 2-4 GM/100ML-% IV SOLN
2.0000 g | INTRAVENOUS | Status: AC
  Administered 2022-07-01: 2 g via INTRAVENOUS
  Filled 2022-07-01: qty 100

## 2022-07-01 MED ORDER — ONDANSETRON HCL 4 MG/2ML IJ SOLN
INTRAMUSCULAR | Status: AC
  Administered 2022-07-01: 4 mg via INTRAVENOUS
  Filled 2022-07-01: qty 2

## 2022-07-01 MED ORDER — ACETAMINOPHEN 10 MG/ML IV SOLN
1000.0000 mg | Freq: Once | INTRAVENOUS | Status: AC
  Administered 2022-07-01: 1000 mg via INTRAVENOUS

## 2022-07-01 MED ORDER — ACETAMINOPHEN 500 MG PO TABS: 1000.0000 mg | ORAL_TABLET | Freq: Once | ORAL | Status: DC

## 2022-07-01 MED ORDER — ONDANSETRON HCL 4 MG/2ML IJ SOLN
INTRAMUSCULAR | Status: DC | PRN
  Administered 2022-07-01: 4 mg via INTRAVENOUS

## 2022-07-01 MED ORDER — BUPIVACAINE HCL 0.25 % IJ SOLN
INTRAMUSCULAR | Status: AC
  Filled 2022-07-01: qty 1

## 2022-07-01 MED ORDER — ROCURONIUM BROMIDE 100 MG/10ML IV SOLN
INTRAVENOUS | Status: DC | PRN
  Administered 2022-07-01: 100 mg via INTRAVENOUS

## 2022-07-01 MED ORDER — FENTANYL CITRATE (PF) 100 MCG/2ML IJ SOLN
INTRAMUSCULAR | Status: DC | PRN
  Administered 2022-07-01: 50 ug via INTRAVENOUS
  Administered 2022-07-01: 25 ug via INTRAVENOUS

## 2022-07-01 MED ORDER — METRONIDAZOLE 500 MG/100ML IV SOLN
INTRAVENOUS | Status: DC | PRN
  Administered 2022-07-01: 500 mg via INTRAVENOUS

## 2022-07-01 SURGICAL SUPPLY — 82 items
ADH SKN CLS APL DERMABOND .7 (GAUZE/BANDAGES/DRESSINGS) ×1
AGENT HMST KT MTR STRL THRMB (HEMOSTASIS)
APL ESCP 34 STRL LF DISP (HEMOSTASIS)
APL SRG 38 LTWT LNG FL B (MISCELLANEOUS) ×1
APPLICATOR ARISTA FLEXITIP XL (MISCELLANEOUS) IMPLANT
APPLICATOR SURGIFLO ENDO (HEMOSTASIS) IMPLANT
BAG COUNTER SPONGE SURGICOUNT (BAG) IMPLANT
BAG LAPAROSCOPIC 12 15 PORT 16 (BASKET) IMPLANT
BAG RETRIEVAL 12/15 (BASKET) ×1
BAG SPNG CNTER NS LX DISP (BAG)
BLADE SURG SZ10 CARB STEEL (BLADE) IMPLANT
COVER BACK TABLE 60X90IN (DRAPES) ×1 IMPLANT
COVER TIP SHEARS 8 DVNC (MISCELLANEOUS) ×1 IMPLANT
COVER TIP SHEARS 8MM DA VINCI (MISCELLANEOUS) ×1
DERMABOND ADVANCED .7 DNX12 (GAUZE/BANDAGES/DRESSINGS) ×1 IMPLANT
DRAPE ARM DVNC X/XI (DISPOSABLE) ×4 IMPLANT
DRAPE COLUMN DVNC XI (DISPOSABLE) ×1 IMPLANT
DRAPE DA VINCI XI ARM (DISPOSABLE) ×4
DRAPE DA VINCI XI COLUMN (DISPOSABLE) ×1
DRAPE SHEET LG 3/4 BI-LAMINATE (DRAPES) ×1 IMPLANT
DRAPE SURG IRRIG POUCH 19X23 (DRAPES) ×1 IMPLANT
DRSG OPSITE POSTOP 4X6 (GAUZE/BANDAGES/DRESSINGS) IMPLANT
DRSG OPSITE POSTOP 4X8 (GAUZE/BANDAGES/DRESSINGS) IMPLANT
ELECT PENCIL ROCKER SW 15FT (MISCELLANEOUS) IMPLANT
ELECT REM PT RETURN 15FT ADLT (MISCELLANEOUS) ×1 IMPLANT
GAUZE 4X4 16PLY ~~LOC~~+RFID DBL (SPONGE) ×2 IMPLANT
GLOVE BIO SURGEON STRL SZ 6 (GLOVE) ×4 IMPLANT
GLOVE BIO SURGEON STRL SZ 6.5 (GLOVE) ×1 IMPLANT
GOWN STRL REUS W/ TWL LRG LVL3 (GOWN DISPOSABLE) ×4 IMPLANT
GOWN STRL REUS W/TWL LRG LVL3 (GOWN DISPOSABLE) ×4
GRASPER SUT TROCAR 14GX15 (MISCELLANEOUS) IMPLANT
HEMOSTAT ARISTA ABSORB 3G PWDR (HEMOSTASIS) IMPLANT
HIBICLENS CHG 4% 4OZ BTL (MISCELLANEOUS) ×2 IMPLANT
HOLDER FOLEY CATH W/STRAP (MISCELLANEOUS) IMPLANT
IRRIG SUCT STRYKERFLOW 2 WTIP (MISCELLANEOUS) ×1
IRRIGATION SUCT STRKRFLW 2 WTP (MISCELLANEOUS) ×1 IMPLANT
KIT PROCEDURE DA VINCI SI (MISCELLANEOUS)
KIT PROCEDURE DVNC SI (MISCELLANEOUS) IMPLANT
KIT TURNOVER KIT A (KITS) IMPLANT
LIGASURE IMPACT 36 18CM CVD LR (INSTRUMENTS) IMPLANT
MANIPULATOR ADVINCU DEL 3.0 PL (MISCELLANEOUS) IMPLANT
MANIPULATOR ADVINCU DEL 3.5 PL (MISCELLANEOUS) IMPLANT
MANIPULATOR UTERINE 4.5 ZUMI (MISCELLANEOUS) IMPLANT
NDL HYPO 21X1.5 SAFETY (NEEDLE) ×1 IMPLANT
NDL SPNL 18GX3.5 QUINCKE PK (NEEDLE) IMPLANT
NEEDLE HYPO 21X1.5 SAFETY (NEEDLE) ×1 IMPLANT
NEEDLE SPNL 18GX3.5 QUINCKE PK (NEEDLE) IMPLANT
OBTURATOR OPTICAL STANDARD 8MM (TROCAR) ×1
OBTURATOR OPTICAL STND 8 DVNC (TROCAR) ×1
OBTURATOR OPTICALSTD 8 DVNC (TROCAR) ×1 IMPLANT
PACK ROBOT GYN CUSTOM WL (TRAY / TRAY PROCEDURE) ×1 IMPLANT
PAD POSITIONING PINK XL (MISCELLANEOUS) ×1 IMPLANT
PORT ACCESS TROCAR AIRSEAL 12 (TROCAR) IMPLANT
SEAL UNIV 5-12 XI (MISCELLANEOUS) ×3 IMPLANT
SEAL XI UNIVERSAL 5-12 (MISCELLANEOUS) ×3
SET TRI-LUMEN FLTR TB AIRSEAL (TUBING) ×1 IMPLANT
SPIKE FLUID TRANSFER (MISCELLANEOUS) ×1 IMPLANT
SPONGE T-LAP 18X18 ~~LOC~~+RFID (SPONGE) IMPLANT
SURGIFLO W/THROMBIN 8M KIT (HEMOSTASIS) IMPLANT
SUT MNCRL AB 4-0 PS2 18 (SUTURE) IMPLANT
SUT PDS AB 1 TP1 96 (SUTURE) IMPLANT
SUT V-LOC 180 0-0 GS22 (SUTURE) IMPLANT
SUT VIC AB 0 CT1 27 (SUTURE)
SUT VIC AB 0 CT1 27XBRD ANTBC (SUTURE) IMPLANT
SUT VIC AB 2-0 CT1 27 (SUTURE)
SUT VIC AB 2-0 CT1 TAPERPNT 27 (SUTURE) IMPLANT
SUT VIC AB 4-0 PS2 18 (SUTURE) ×2 IMPLANT
SUT VICRYL 0 27 CT2 27 ABS (SUTURE) ×1 IMPLANT
SUT VLOC 180 0 9IN  GS21 (SUTURE)
SUT VLOC 180 0 9IN GS21 (SUTURE) IMPLANT
SYR 10ML LL (SYRINGE) IMPLANT
SYS BAG RETRIEVAL 10MM (BASKET) ×1
SYS WOUND ALEXIS 18CM MED (MISCELLANEOUS)
SYSTEM BAG RETRIEVAL 10MM (BASKET) IMPLANT
SYSTEM WOUND ALEXIS 18CM MED (MISCELLANEOUS) IMPLANT
TOWEL OR NON WOVEN STRL DISP B (DISPOSABLE) IMPLANT
TRAP SPECIMEN MUCUS 40CC (MISCELLANEOUS) IMPLANT
TRAY FOLEY MTR SLVR 16FR STAT (SET/KITS/TRAYS/PACK) ×1 IMPLANT
TROCAR PORT AIRSEAL 5X120 (TROCAR) IMPLANT
UNDERPAD 30X36 HEAVY ABSORB (UNDERPADS AND DIAPERS) ×2 IMPLANT
WATER STERILE IRR 1000ML POUR (IV SOLUTION) ×1 IMPLANT
YANKAUER SUCT BULB TIP 10FT TU (MISCELLANEOUS) IMPLANT

## 2022-07-01 NOTE — H&P (Signed)
Gynecologic Oncology H&P  07/01/22  Treatment History: The patient endorsed difficulty initiating urinary stream, dyspareunia and urinary incontinence.  MRI of the pelvis was obtained  on 02/28/22 by her primary care provider showing a 3.8 cm retropubic mass pushing on the bladder and urethra.  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  A single 6.7 cm uterine fibroid in the central uterine corpus and fundus, with intramural and submucosal components, was also noted. No intracavitary or pedunculated fibroids identified. Normal appearance of both ovaries. No adnexal mass identified.   Today, the patient reports a long history of known uterine fibroids.  She was followed for many years by Dr. Leo Grosser, who has since retired.  Patient underwent laparoscopic myomectomy at least 20 years ago although her larger uterine fibroid was left given concern that removal of this fibroid would cause significant risk of needing hysterectomy.  The plan had been to watch her through menopause at which time the hope was that her fibroid might shrink.  Patient's history is also notable for 2 D&Cs as part of treatment and/or workup of infertility.  She also has had 3 ectopic surgeries including left salpingectomy and was treated for fourth ectopic pregnancy with methotrexate.   Notes that over the last 5+ years she has had multiple symptoms including intermittent urinary incontinence, now has to wear a pad.  She also has intermittent retention.  She describes this as sometimes having to urinate in the morning when she gets up but being unable to do so.  She also has dyspareunia.  She thinks the symptoms have worsened somewhat recently.  Interval History: Doing well, no significant change since last visit with me.  Past Medical/Surgical History: Past Medical History:  Diagnosis Date   Anemia    Anemia    B12 deficiency    Chlamydia    Family history of adverse  reaction to anesthesia    Mom nausea   Heart murmur    Herpes    History of ectopic pregnancy    History of infertility, female    Infertility, female    Menorrhagia    Obesity    Yeast infection     Past Surgical History:  Procedure Laterality Date   COLONOSCOPY     DILATATION & CURETTAGE/HYSTEROSCOPY WITH MYOSURE N/A 02/20/2014   Procedure: [place];  Surgeon: Eldred Manges, MD;  Location: [facility];  Service: Gynecology;  Laterality: N/A;   DILATION AND CURETTAGE OF UTERUS     LAPAROSCOPIC [place] ASSISTED MYOMECTOMY     SALPINGECTOMY Left    for ectopic    Family History  Problem Relation Age of Onset   Hypertension Mother    Depression Father    Anxiety disorder Father    Bipolar disorder Father    Cancer Maternal Grandmother        breast? did die from this   Breast cancer Maternal Grandmother    Heart disease Paternal Grandmother    Hypertension Paternal Grandmother    Colon cancer Neg Hx    Ovarian cancer Neg Hx    Endometrial cancer Neg Hx    Pancreatic cancer Neg Hx    Prostate cancer Neg Hx     Social History   Socioeconomic History   Marital status: Married    Spouse name: Not on file   Number of children: Not on file   Years of education: Not on file   Highest education level: Not on  file  Occupational History   Not on file  Tobacco Use   Smoking status: Never   Smokeless tobacco: Never  Vaping Use   Vaping Use: Never used  Substance and Sexual Activity   Alcohol use: Not Currently    Comment: occasional   Drug use: No   Sexual activity: Yes  Other Topics Concern   Not on file  Social History Narrative   Not on file   Social Determinants of Health   Financial Resource Strain: Not on file  Food Insecurity: No Food Insecurity (05/14/2022)   Hunger Vital Sign    Worried About Running Out of Food in the Last Year: Never true    Ran Out of Food in the Last Year: Never true  Transportation Needs: No  Transportation Needs (05/14/2022)   PRAPARE - Hydrologist (Medical): No    Lack of Transportation (Non-Medical): No  Physical Activity: Not on file  Stress: Not on file  Social Connections: Socially Integrated (05/14/2022)   Social Connection and Isolation Panel [NHANES]    Frequency of Communication with Friends and Family: More than three times a week    Frequency of Social Gatherings with Friends and Family: Not on file    Attends Religious Services: More than 4 times per year    Active Member of [employer] or Organizations: Yes    Attends Music therapist: More than 4 times per year    Marital Status: Married    Current Medications:  Current Facility-Administered Medications:    acetaminophen (TYLENOL) tablet 1,000 mg, 1,000 mg, Oral, On Call to OR, Cross, Melissa D, NP   ceFAZolin (ANCEF) IVPB 2g/100 mL premix, 2 g, Intravenous, On Call to OR, Cross, Melissa D, NP   chlorhexidine (PERIDEX) 0.12 % solution 15 mL, 15 mL, Mouth/Throat, Once **OR** Oral care mouth rinse, 15 mL, Mouth Rinse, Once, Annye Asa, MD   dexamethasone (DECADRON) injection 4 mg, 4 mg, Intravenous, On Call to OR, Cross, Melissa D, NP   gabapentin (NEURONTIN) capsule 300 mg, 300 mg, Oral, On Call to OR, Cross, Melissa D, NP   heparin injection 5,000 Units, 5,000 Units, Subcutaneous, 120 min pre-op, Cross, Melissa D, NP   ketorolac (TORADOL) 15 MG/ML injection 15 mg, 15 mg, Intravenous, On Call to OR, Cross, Melissa D, NP   lactated ringers infusion, , Intravenous, Continuous, Annye Asa, MD   scopolamine (TRANSDERM-SCOP) 1 MG/3DAYS 1.5 mg, 1 patch, Transdermal, On Call to OR, Cross, Melissa D, NP  Review of Systems: + dyspareunia, urinary symptoms Denies appetite changes, fevers, chills, fatigue, unexplained weight changes. Denies hearing loss, neck lumps or masses, mouth sores, ringing in ears or voice changes. Denies cough or wheezing.  Denies shortness of  breath. Denies chest pain or palpitations. Denies leg swelling. Denies abdominal distention, pain, blood in stools, constipation, diarrhea, nausea, vomiting, or early satiety. Denies dysuria, hematuria. Denies hot flashes, pelvic pain, vaginal bleeding or vaginal discharge.   Denies joint pain, back pain or muscle pain/cramps. Denies itching, rash, or wounds. Denies dizziness, headaches, numbness or seizures. Denies swollen lymph nodes or glands, denies easy bruising or bleeding. Denies anxiety, depression, confusion, or decreased concentration.  Physical Exam: LMP 02/14/2017  General: Alert, oriented, no acute distress.  HEENT: Normocephalic, atraumatic. Sclera anicteric.  Chest: Clear to auscultation bilaterally. No wheezes, rhonchi, or rales. Cardiovascular: Regular rate and rhythm, no murmurs, rubs, or gallops.  Abdomen: Obese. Normoactive bowel sounds. Soft, nondistended, nontender to palpation. No masses or hepatosplenomegaly  appreciated. No palpable fluid wave.  Well-healed laparoscopic incisions. Extremities  Laboratory & Radiologic Studies:    Latest Ref Rng & Units 06/19/2022   10:21 AM 03/07/2019    9:00 AM 02/20/2014    8:15 AM  CBC  WBC 4.0 - 10.5 K/uL 5.5   5.4   Hemoglobin 12.0 - 15.0 g/dL 11.6   11.8   Hematocrit 36.0 - 46.0 % 37.8  35.4  37.1   Platelets 150 - 400 K/uL 231   243       Latest Ref Rng & Units 06/19/2022   10:21 AM 07/27/2019   10:08 AM 05/01/2009    3:03 PM  BMP  Glucose 70 - 99 mg/dL 89  87  94   BUN 6 - 20 mg/dL 15  17  5    Creatinine 0.44 - 1.00 mg/dL 0.69  0.76  0.8   BUN/Creat Ratio 9 - 23  22    Sodium 135 - 145 mmol/L 140  142  140   Potassium 3.5 - 5.1 mmol/L 4.1  4.1  3.7   Chloride 98 - 111 mmol/L 107  107  109   CO2 22 - 32 mmol/L 26  24    Calcium 8.9 - 10.3 mg/dL 9.0  9.3     Assessment & Plan: Sarah Villa is a 57 y.o. woman with long history of uterine fibroids and urinary symptoms.   Definitive surgery today, see counseling  from 05/15/22. Plan for TRH/BSO, resection of retropubic mass.  Jeral Pinch, MD  Division of Gynecologic Oncology  Department of Obstetrics and Gynecology  [facility]

## 2022-07-01 NOTE — Transfer of Care (Signed)
Immediate Anesthesia Transfer of Care Note  Patient: Sarah Villa  Procedure(s) Performed: XI ROBOTIC ASSISTED TOTAL HYSTERECTOMY WITH BILATERAL OOPHORECTOMY, UNILATERAL SALPINGECTOMY, PELVIC MASS EXCISION (Abdomen) LAPAROTOMY (Abdomen)  Patient Location: PACU  Anesthesia Type:General  Level of Consciousness: sedated, patient cooperative, and responds to stimulation  Airway & Oxygen Therapy: Patient Spontanous Breathing and Patient connected to face mask oxygen  Post-op Assessment: Report given to RN, Post -op Vital signs reviewed and stable, and Patient moving all extremities  Post vital signs: Reviewed and stable  Last Vitals:  Vitals Value Taken Time  BP 102/66 07/01/22 1217  Temp    Pulse 74 07/01/22 1221  Resp 28 07/01/22 1221  SpO2 100 % 07/01/22 1221  Vitals shown include unvalidated device data.  Last Pain:  Vitals:   07/01/22 0733  TempSrc:   PainSc: 0-No pain         Complications: No notable events documented.

## 2022-07-01 NOTE — Brief Op Note (Signed)
07/01/2022  11:53 AM  PATIENT:  Sarah Villa  57 y.o. female  PRE-OPERATIVE DIAGNOSIS:  pelvic mass, uterine fibroids  POST-OPERATIVE DIAGNOSIS:  pelvic mass,uterine fibroids  PROCEDURE:  Procedure(s): XI ROBOTIC ASSISTED TOTAL HYSTERECTOMY WITH BILATERAL OOPHORECTOMY, UNILATERAL SALPINGECTOMY, PELVIC MASS EXCISION (N/A) LAPAROTOMY (N/A)  SURGEON:  Surgeon(s) and Role:    Lafonda Mosses, MD - Primary  ASSISTANTS: Joylene John NP   ANESTHESIA:   general  EBL:  75 mL   BLOOD ADMINISTERED:none  DRAINS: none   LOCAL MEDICATIONS USED:  marcaine, exparel  SPECIMEN:  washings, uterus, cervix, bilateral ovaries, right tubes, retropubic mass  DISPOSITION OF SPECIMEN:  PATHOLOGY  COUNTS:  YES  TOURNIQUET:  * No tourniquets in log *  DICTATION: .Note written in EPIC  PLAN OF CARE: Admit for overnight observation  PATIENT DISPOSITION:  PACU - hemodynamically stable.   Delay start of Pharmacological VTE agent (>24hrs) due to surgical blood loss or risk of bleeding: not applicable

## 2022-07-01 NOTE — Op Note (Signed)
OPERATIVE NOTE  Pre-operative Diagnosis: Enlarged uterine fibroid, retropubic mass, urinary symptoms including retention  Post-operative Diagnosis: same, retropubic mass c/w benign leiomyoma on frozen section  Operation: Robotic-assisted laparoscopic total hysterectomy with bilateral oophorectomy, right salpingectomy, excision of retropubic mass (approximately 3-4 cm) requiring development of the space of Retzius  Surgeon: Jeral Pinch MD  Assistant Surgeon: Joylene John NP  Anesthesia: GET  Urine Output: 250 cc  Operative Findings: On EUA, 10 cm mobile uterus. On intra-abdominal entry, normal upper abdominal survey. Normal omentum, small and large bowel. Normal appendix. Left tube surgically absent. Normal appearing right tube and bilateral ovaries. Uterus 10-12 cm and very bulbous. 3-4 cm retropubic mass noted just inferior to the pubic symphysis with some adhesions posteriorly to the bladder. Mass free of the urethra. Bladder backfilled at start of retropubic dissection to help delineate its contours and again after mass removal to assure to bladder injury.  Estimated Blood Loss:  75 cc     Total IV Fluids: see I&O flowsheet         Specimens: uterus, cervix, bilateral tubes and ovaries, retropubic mass, pelvic washings         Complications:  None apparent; patient tolerated the procedure well.         Disposition: PACU - hemodynamically stable.  Procedure Details  The patient was seen in the Holding Room. The risks, benefits, complications, treatment options, and expected outcomes were discussed with the patient.  The patient concurred with the proposed plan, giving informed consent.  The site of surgery properly noted/marked. The patient was identified as Veterinary surgeon and the procedure verified as a Robotic-assisted hysterectomy with bilateral salpingo oophorectomy.   After induction of anesthesia, the patient was draped and prepped in the usual sterile manner. Patient was  placed in supine position after anesthesia and draped and prepped in the usual sterile manner as follows: Her arms were tucked to her side with all appropriate precautions.  The patient was secured to the bed using padding and tape across her chest.  The patient was placed in the semi-lithotomy position in [place].  The perineum and vagina were prepped with ChloraPrep. The patient's abdomen was prepped with ChloraPrep and then she was draped after the prep had been allowed to dry for 3 minutes.  A Time Out was held and the above information confirmed.  The urethra was prepped with Betadine. Foley catheter was placed.  A sterile speculum was placed in the vagina.  The cervix was grasped with a single-tooth tenaculum. The cervix was dilated with Kennon Rounds dilators.  The ZUMI uterine manipulator with a medium colpotomizer ring was placed without difficulty.  A pneum occluder balloon was placed over the manipulator.  OG tube placement was confirmed and to suction.   Next, a 10 mm skin incision was made 1 cm below the subcostal margin in the midclavicular line.  The 5 mm Optiview port and scope was used for direct entry.  Opening pressure was under 10 mm CO2.  The abdomen was insufflated and the findings were noted as above.   At this point and all points during the procedure, the patient's intra-abdominal pressure did not exceed 15 mmHg. Next, an 8 mm skin incision was made superior to the umbilicus and a right and left port were placed about 8 cm lateral to the robot port on the right and left side.  A fourth arm was placed on the right.  The 5 mm assist trocar was exchanged for a 10-12 mm  port. All ports were placed under direct visualization.  The patient was placed in steep Trendelenburg.  Bowel was folded away into the upper abdomen.  The robot was docked in the normal manner.  The right and left peritoneum were opened parallel to the IP ligament to open the retroperitoneal spaces bilaterally. The round  ligaments were transected. The ureter was noted to be on the medial leaf of the broad ligament.  The peritoneum above the ureter was incised and stretched and the infundibulopelvic ligament was skeletonized, cauterized and cut.    The posterior peritoneum was taken down to the level of the KOH ring.  The anterior peritoneum was also taken down.  The bladder flap was created to the level of the KOH ring.  The uterine artery on the right side was skeletonized, cauterized and cut in the normal manner.  A similar procedure was performed on the left.  The colpotomy was made and the uterus, cervix, bilateral ovaries and right tube were amputated and placed in an Endocatch bag to be removed through a mini-lap.  Pedicles were inspected and excellent hemostasis was achieved.    The bladder was then instilled with 500 cc of sterile fluid. Traction was placed on the superior aspect of the bladder and the bladder was separated from the anterior wall peritoneum. The space of Retzius was then developed using a combination of sharp dissection, blunt dissection and short bursts of electrocautery. The medial umbilical ligament and medial umbilical ligaments were transected during this dissection. Once the bladder was sufficiently mobilized, 200 cc of fluid was removed form the bladder. Below the superior aspect of the pubic bone, a 3-4 cm firm mass was encountered with some denser adhesions to the anterior bladder. With manipulation of the mass and meticulous dissection, the mass was separate from the surrounding structures including bone anterior, bladder posteriorly and urethra inferiorly (although not directly adherent to the urethra). Once removed, the mass was placed in an endocatch bag and handed out of the vagina to be sent for frozen section. The bladder was back-filled again with 300 cc of sterile fluid with no leak noted. The foley catheter was moved with no injury noted to the urethra. The surgical bed was irrigated  and short bursts of electrocautery were used to achieve hemostasis. Good hemostasis was noted.  The colpotomy at the vaginal cuff was closed with 0 Vicryl with a figure of eight stitch at each apex and 0 V-Loc to close the midportion of the cuff in running manner.  Irrigation was used and excellent hemostasis was achieved.  Abdominal pressure was decreased to 5 mm Hg with excellent hemostasis noted. Arista was placed over the deep retropubic excision bed. At this point in the procedure was completed.  Robotic instruments were removed under direct visulaization.  The robot was undocked. The fascia at the 10-12 mm port was closed with 0 Vicryl on a UR-5 needle.  The subcuticular tissue was closed with 4-0 Vicryl and the skin was closed with 4-0 Monocryl in a subcuticular manner.  Dermabond was applied.    The supraumbilical trocar was removed and the incision extended 8-10 cm with a scalpel. The incision was carried down to and through the fascia, with the abdomen insufflated, using monopolar electrocautery. The peritoneal incision was extended under direct visualization. The endocatch bag with the uterine specimen was delivered through the incision. The incision was then closed with running #1 looped PDS tied in the midline. The subcutaneous tissue was irrigated and hemostasis achieved.  Exparel was injected for local anesthesia. The subcutaneous tissue was closed with 2-0 Vicyrl in running fashion.   The fascia at the 10-12 mm port was closed with 0 Vicryl on a UR-5 needle.  The subcuticular tissue of all incisions was closed with 4-0 Vicryl and the skin was closed with 4-0 Monocryl in a subcuticular manner.  Dermabond was applied.    The vagina was swabbed with  minimal bleeding noted. Foley catheter was left in situ.  All sponge, lap and needle counts were correct x  3.   The patient was transferred to the recovery room in stable condition.  Jeral Pinch, MD

## 2022-07-01 NOTE — Anesthesia Postprocedure Evaluation (Signed)
Anesthesia Post Note  Patient: Sarah Villa  Procedure(s) Performed: XI ROBOTIC ASSISTED TOTAL HYSTERECTOMY WITH BILATERAL OOPHORECTOMY, UNILATERAL SALPINGECTOMY, PELVIC MASS EXCISION (Abdomen) LAPAROTOMY (Abdomen)     Patient location during evaluation: PACU Anesthesia Type: General Level of consciousness: awake and alert, oriented and patient cooperative Pain management: pain level controlled Vital Signs Assessment: post-procedure vital signs reviewed and stable Respiratory status: spontaneous breathing, nonlabored ventilation and respiratory function stable Cardiovascular status: blood pressure returned to baseline and stable Postop Assessment: no apparent nausea or vomiting Anesthetic complications: no   No notable events documented.  Last Vitals:  Vitals:   07/01/22 1400 07/01/22 1415  BP: 101/62 115/64  Pulse: 74 62  Resp: 15   Temp: 36.5 C 36.4 C  SpO2: 93% 95%    Last Pain:  Vitals:   07/01/22 1415  TempSrc:   PainSc: [place]

## 2022-07-01 NOTE — Discharge Instructions (Addendum)
AFTER SURGERY INSTRUCTIONS   Return to work: 4-6 weeks if applicable  You are being discharged home with the foley catheter in. We will plan on seeing you in the office on this Friday for a voiding trial and removal of foley. Please empty the catheter at home and call for any issues such as decreased or no urine, bloody urine etc. The catheter works by gravity so the bag will need to be lower than your abdomen/pelvis in order for the urine to flow out. This means when in bed or in a chair, have the bag resting on the floor.  Activity: 1. Be up and out of the bed during the day.  Take a nap if needed.  You may walk up steps but be careful and use the hand rail.  Stair climbing will tire you more than you think, you may need to stop part way and rest.    2. No lifting or straining for 6 weeks over 10 pounds. No pushing, pulling, straining for 6 weeks.   3. No driving for around 1 week(s).  Do not drive if you are taking narcotic pain medicine and make sure that your reaction time has returned.    4. You can shower as soon as the next day after surgery. Shower daily.  Use your regular soap and water (not directly on the incision) and pat your incision(s) dry afterwards; don't rub.  No tub baths or submerging your body in water until cleared by your surgeon. If you have the soap that was given to you by pre-surgical testing that was used before surgery, you do not need to use it afterwards because this can irritate your incisions.    5. No sexual activity and nothing in the vagina for 10-12 weeks.   6. You may experience a small amount of clear drainage from your incisions, which is normal.  If the drainage persists, increases, or changes color please call the office.   7. Do not use creams, lotions, or ointments such as neosporin on your incisions after surgery until advised by your surgeon because they can cause removal of the dermabond glue on your incisions.     8. You may experience vaginal  spotting after surgery or around the 6-8 week mark from surgery when the stitches at the top of the vagina begin to dissolve.  The spotting is normal but if you experience heavy bleeding, call our office.   9. Take Tylenol or ibuprofen first for pain if you are able to take these medications and only use narcotic pain medication for severe pain not relieved by the Tylenol or Ibuprofen.  Monitor your Tylenol intake to a max of 4,000 mg in a 24 hour period. You can alternate these medications after surgery.   Diet: 1. Low sodium Heart Healthy Diet is recommended but you are cleared to resume your normal (before surgery) diet after your procedure.   2. It is safe to use a laxative, such as Miralax or Colace, if you have difficulty moving your bowels. You have been prescribed Sennakot-S to take at bedtime every evening after surgery to keep bowel movements regular and to prevent constipation.     Wound Care: 1. Keep clean and dry.  Shower daily.   Reasons to call the Doctor: Fever - Oral temperature greater than 100.4 degrees Fahrenheit Foul-smelling vaginal discharge Difficulty urinating Nausea and vomiting Increased pain at the site of the incision that is unrelieved with pain medicine. Difficulty breathing with or without  chest pain New calf pain especially if only on one side Sudden, continuing increased vaginal bleeding with or without clots.   Contacts: For questions or concerns you should contact:   Dr. Jeral Pinch at [phone]   Joylene John, NP at [phone]   After Hours: call [phone] and have the GYN Oncologist paged/contacted (after 5 pm or on the weekends).   Messages sent via mychart are for non-urgent matters and are not responded to after hours so for urgent needs, please call the after hours number.

## 2022-07-01 NOTE — Anesthesia Procedure Notes (Signed)
Procedure Name: Intubation Date/Time: 07/01/2022 8:38 AM  Performed by: Randye Lobo, CRNAPre-anesthesia Checklist: Patient identified, Emergency Drugs available, Suction available and Patient being monitored Patient Re-evaluated:Patient Re-evaluated prior to induction Oxygen Delivery Method: Circle System Utilized Preoxygenation: Pre-oxygenation with 100% oxygen Induction Type: IV induction Ventilation: Mask ventilation without difficulty Laryngoscope Size: Mac and 3 Grade View: Grade II Tube type: Oral Tube size: 7.0 mm Number of attempts: 1 Airway Equipment and Method: Stylet and Oral airway Placement Confirmation: ETT inserted through vocal cords under direct vision, positive ETCO2 and breath sounds checked- equal and bilateral Secured at: 22 cm Tube secured with: Tape Dental Injury: Teeth and Oropharynx as per pre-operative assessment

## 2022-07-02 ENCOUNTER — Encounter (HOSPITAL_COMMUNITY): Payer: Self-pay | Admitting: Gynecologic Oncology

## 2022-07-02 ENCOUNTER — Telehealth: Payer: Self-pay

## 2022-07-02 LAB — CYTOLOGY - NON PAP

## 2022-07-02 LAB — SURGICAL PATHOLOGY

## 2022-07-02 NOTE — Telephone Encounter (Signed)
Voicemail left for patient to call office for post- op check

## 2022-07-02 NOTE — Telephone Encounter (Signed)
error 

## 2022-07-02 NOTE — Telephone Encounter (Addendum)
Spoke with Ms. Kiener this morning. She states she is eating, drinking and catheter is draining well. She has not had a BM yet but is passing gas. She is taking senokot as prescribed and encouraged her to drink plenty of water. She denies fever or chills. Incisions are dry and intact. She rates her pain 5/10. Her pain is controlled with Ibuprofen    Instructed to call office with any fever, chills, purulent drainage, uncontrolled pain or any other questions or concerns. Patient verbalizes understanding.   Pt aware of post op appointments as well as the office number [phone] and after hours number [phone] to call if she has any questions or concerns

## 2022-07-03 ENCOUNTER — Other Ambulatory Visit: Payer: Self-pay

## 2022-07-03 ENCOUNTER — Inpatient Hospital Stay: Payer: Commercial Managed Care - PPO | Attending: Gynecologic Oncology

## 2022-07-03 VITALS — BP 130/76 | HR 71 | Temp 98.5°F | Wt 225.4 lb

## 2022-07-03 DIAGNOSIS — R1900 Intra-abdominal and pelvic swelling, mass and lump, unspecified site: Secondary | ICD-10-CM

## 2022-07-03 NOTE — Progress Notes (Signed)
GYN Oncology Post-operative Follow Up   Sarah Villa is a 57 year old female with a history of pelvic mass who underwent XI ROBOTIC ASSISTED TOTAL HYSTERECTOMY WITH BILATERAL OOPHORECTOMY, UNILATERAL SALPINGECTOMY, PELVIC MASS EXCISION on 07/01/22  with Dr. Jeral Pinch. She presents to the office today for foley catheter removal and trial voiding.    She states she has been doing well at home. Tolerating diet with no nausea or emesis.  She states her pain is manageable.  Her bowels are moving without difficulty.  She states she has had adequate urine output in the catheter at home. She denies any vaginal bleeding or discharge.  Denies chest pain and shortness of breath.  No fever.  No lower extremity edema reported.  No concerns voiced.   Exam: Alert, oriented x 3, in no acute distress.    Clear, yellow urine noted in the catheter bag. 210 cc of sterile normal saline instilled in the bladder via the catheter. Patient tolerated this well. Foley was removed without difficulty. The patient was able to void 250 cc after foley removal.    Assessment/Plan: 57 year old s/p XI ROBOTIC ASSISTED TOTAL HYSTERECTOMY WITH BILATERAL OOPHORECTOMY, UNILATERAL SALPINGECTOMY, PELVIC MASS EXCISION presenting to the office for foley catheter removal and voiding trial. She was able to void 250 cc without difficulty after instillation of 210 cc of sterile normal saline. Reportable signs and symptoms reviewed including signs of urinary retention. She is advised to attempt to void every 2-3 hours and to record her output. A urine measuring collector "hat" given to the patient. No concerns voiced at the visit.     This appointment is included in the global surgical bundle and has no charge.

## 2022-07-03 NOTE — Patient Instructions (Signed)
It was good to see you today. Today we removed your urinary catheter. It is important to try to void every 2-3 hours and track your urinary output. If you have difficulty voiding, are not able to empty your bladder or have pain, odor, fever, or chills please notify our office at [phone]. If the office is closed please call the after hours number at [phone] and ask to speak to the gyn oncologist on call. Please to not hesitate to contact us with any questions or concerns.

## 2022-07-08 ENCOUNTER — Inpatient Hospital Stay: Payer: Commercial Managed Care - PPO | Admitting: Gynecologic Oncology

## 2022-07-16 DIAGNOSIS — D509 Iron deficiency anemia, unspecified: Secondary | ICD-10-CM | POA: Diagnosis not present

## 2022-07-30 NOTE — Progress Notes (Signed)
Gynecologic Oncology Return Clinic Visit  07/31/22  Reason for Visit: post-op  Treatment History: 07/01/22: Robotic-assisted laparoscopic total hysterectomy with bilateral oophorectomy, right salpingectomy, excision of retropubic mass (approximately 3-4 cm) requiring development of the space of Retzius   Interval History: Patient reports doing well.  She has some intermittent pelvic twinges, denies any significant pelvic pain.  Today feeling some right lower quadrant soreness.  Reports normal bowel function, still using Senokot intermittently.  Denies any vaginal bleeding or discharge.  Is having some urinary incontinence, not constantly, sometimes when her bladder is full and sometimes when it is not full.  Denies any leaking with cough, laugh, or sneeze that she has noticed.  Past Medical/Surgical History: Past Medical History:  Diagnosis Date   Anemia    Anemia    B12 deficiency    Chlamydia    Family history of adverse reaction to anesthesia    Mom nausea   Heart murmur    Herpes    History of ectopic pregnancy    History of infertility, female    Infertility, female    Menorrhagia    Obesity    Yeast infection     Past Surgical History:  Procedure Laterality Date   COLONOSCOPY     DILATATION & CURETTAGE/HYSTEROSCOPY WITH MYOSURE N/A 02/20/2014   Procedure: DILATATION & CURETTAGE/HYSTEROSCOPY WITH MYOSURE;  Surgeon: Hal Morales, MD;  Location: [facility];  Service: Gynecology;  Laterality: N/A;   DILATION AND CURETTAGE OF UTERUS     LAPAROSCOPIC GELPORT ASSISTED MYOMECTOMY     LAPAROTOMY WITH STAGING N/A 07/01/2022   Procedure: LAPAROTOMY;  Surgeon: Carver Fila, MD;  Location: [facility];  Service: Gynecology;  Laterality: N/A;   ROBOTIC ASSISTED TOTAL HYSTERECTOMY WITH BILATERAL SALPINGO OOPHERECTOMY N/A 07/01/2022   Procedure: XI ROBOTIC ASSISTED TOTAL HYSTERECTOMY WITH BILATERAL OOPHORECTOMY, UNILATERAL SALPINGECTOMY, PELVIC MASS EXCISION;  Surgeon: Carver Fila, MD;  Location: [facility];  Service: Gynecology;  Laterality: N/A;   SALPINGECTOMY Left    for ectopic    Family History  Problem Relation Age of Onset   Hypertension Mother    Depression Father    Anxiety disorder Father    Bipolar disorder Father    Cancer Maternal Grandmother        breast? did die from this   Breast cancer Maternal Grandmother    Heart disease Paternal Grandmother    Hypertension Paternal Grandmother    Colon cancer Neg Hx    Ovarian cancer Neg Hx    Endometrial cancer Neg Hx    Pancreatic cancer Neg Hx    Prostate cancer Neg Hx     Social History   Socioeconomic History   Marital status: Married    Spouse name: Not on file   Number of children: Not on file   Years of education: Not on file   Highest education level: Not on file  Occupational History   Not on file  Tobacco Use   Smoking status: Never   Smokeless tobacco: Never  Vaping Use   Vaping Use: Never used  Substance and Sexual Activity   Alcohol use: Not Currently    Comment: occasional   Drug use: No   Sexual activity: Yes  Other Topics Concern   Not on file  Social History Narrative   Not on file   Social Determinants of Health   Financial Resource Strain: Not on file  Food Insecurity: No Food Insecurity (05/14/2022)   Hunger Vital Sign    Worried  About Running Out of Food in the Last Year: Never true    Ran Out of Food in the Last Year: Never true  Transportation Needs: No Transportation Needs (05/14/2022)   PRAPARE - Administrator, Civil Service (Medical): No    Lack of Transportation (Non-Medical): No  Physical Activity: Not on file  Stress: Not on file  Social Connections: Socially Integrated (05/14/2022)   Social Connection and Isolation Panel [NHANES]    Frequency of Communication with Friends and Family: More than three times a week    Frequency of Social Gatherings with Friends and Family: Not on file    Attends Religious Services: More than 4 times  per year    Active Member of Clubs or Organizations: Yes    Attends Engineer, structural: More than 4 times per year    Marital Status: Married    Current Medications:  Current Outpatient Medications:    b complex vitamins capsule, Take 1 capsule by mouth daily., Disp: , Rfl:    cetirizine (ZYRTEC) 10 MG tablet, Take 10 mg by mouth daily., Disp: , Rfl:    Cholecalciferol (VITAMIN D3) 50 MCG (2000 UT) capsule, Take 2,000 Units by mouth daily., Disp: , Rfl:    ferrous sulfate 325 (65 FE) MG tablet, Take 1 tablet by mouth once daily., Disp: 100 tablet, Rfl: 5   ibuprofen (ADVIL) 800 MG tablet, Take 1 tablet (800 mg total) by mouth every 8 (eight) hours as needed for moderate pain. For AFTER surgery only, Disp: 30 tablet, Rfl: 0   Investigational - Study Medication, Inject 1 Dose into the skin once a week. Study name: Unknown Additional study details: Weight loss injection, Disp: , Rfl:    senna-docusate (SENOKOT-S) 8.6-50 MG tablet, Take 2 tablets by mouth at bedtime. For AFTER surgery, do not take if having diarrhea, Disp: 100 tablet, Rfl: 0  Review of Systems: Denies appetite changes, fevers, chills, fatigue, unexplained weight changes. Denies hearing loss, neck lumps or masses, mouth sores, ringing in ears or voice changes. Denies cough or wheezing.  Denies shortness of breath. Denies chest pain or palpitations. Denies leg swelling. Denies abdominal distention, pain, blood in stools, constipation, diarrhea, nausea, vomiting, or early satiety. Denies pain with intercourse, dysuria, frequency, hematuria or incontinence. Denies hot flashes, pelvic pain, vaginal bleeding or vaginal discharge.   Denies joint pain, back pain or muscle pain/cramps. Denies itching, rash, or wounds. Denies dizziness, headaches, numbness or seizures. Denies swollen lymph nodes or glands, denies easy bruising or bleeding. Denies anxiety, depression, confusion, or decreased concentration.  Physical  Exam: BP (!) 115/55 (BP Location: Left Arm, Patient Position: Sitting)   Pulse 87   Temp 98.4 F (36.9 C) (Oral)   Wt 213 lb 9.6 oz (96.9 kg)   LMP 02/14/2017   SpO2 100%   BMI 36.66 kg/m  General: Alert, oriented, no acute distress. HEENT: Normocephalic, atraumatic, sclera anicteric. Chest: Unlabored breathing on room air. Abdomen: Obese, soft, nontender.  Normoactive bowel sounds.  No masses or hepatosplenomegaly appreciated.  Well-healed incisions, mild keloiding of left upper quadrant and supraumbilical incision. Extremities: Grossly normal range of motion.  Warm, well perfused.  No edema bilaterally. Skin: No rashes or lesions noted. GU: Normal appearing external genitalia without erythema, excoriation, or lesions.  Speculum exam reveals vaginal cuff intact, suture visible, no fluid noted within the vagina with Valsalva.  Bimanual exam reveals intact, no tenderness or fluctuance with palpation.    Laboratory & Radiologic Studies: A. RETROPUBIC MASS,  EXCISION:  - Leiomyoma, 4.2 cm  - Present at resection margin  - Negative for malignancy   B. UTERUS, CERVIX, BILATERAL OVARIES, RIGHT FALLOPIAN TUBE, RESECTION:  Cervix:  - Benign squamous mucosa and endocervical mucosa with nabothian cysts;  negative for dysplasia  Uterus:  - Benign inactive endometrium; negative for hyperplasia  - Leiomyomata with focal hyalinization  Bilateral ovaries and right fallopian tube:  - Bilateral, benign ovaries with simple cysts  - Benign right, fimbriated fallopian tube  - Negative for malignancy   Assessment & Plan: Sarah Villa is a 57 y.o. woman with fibroid uterus and retropubic mass consistent with fibroid, now status post robotic surgery.  Patient is doing very well after surgery, meeting milestones.  Discussed continued expectations and restrictions.  We discussed her urinary symptoms.  There is no evidence of fistula between the bladder and vagina on exam and her history does not sound  consistent with more frequent leakage, which I would expect in the setting of a fistula.  My suspicion is that she is having some pelvic floor dysfunction aided to pelvic muscles, degree of bladder dissection during surgery.  I have encouraged her to to perform timed voids every 2 hours even if she does not need to urinate and to start doing some Kegel exercises.  I have asked her to give me an update either by MyChart or phone in the next 3-6 weeks.  Pending improvement of her urinary symptoms, the patient will be discharged back to her primary care provider and OB/GYN.  18 minutes of total time was spent for this patient encounter, including preparation, face-to-face counseling with the patient and coordination of care, and documentation of the encounter.  Eugene Garnet, MD  Division of Gynecologic Oncology  Department of Obstetrics and Gynecology  [facility]

## 2022-07-31 ENCOUNTER — Encounter: Payer: Self-pay | Admitting: Gynecologic Oncology

## 2022-07-31 ENCOUNTER — Inpatient Hospital Stay: Payer: Commercial Managed Care - PPO | Attending: Gynecologic Oncology | Admitting: Gynecologic Oncology

## 2022-07-31 ENCOUNTER — Other Ambulatory Visit: Payer: Self-pay

## 2022-07-31 VITALS — BP 115/55 | HR 87 | Temp 98.4°F | Wt 213.6 lb

## 2022-07-31 DIAGNOSIS — R1900 Intra-abdominal and pelvic swelling, mass and lump, unspecified site: Secondary | ICD-10-CM

## 2022-07-31 DIAGNOSIS — Z90722 Acquired absence of ovaries, bilateral: Secondary | ICD-10-CM

## 2022-07-31 DIAGNOSIS — R32 Unspecified urinary incontinence: Secondary | ICD-10-CM

## 2022-07-31 DIAGNOSIS — Z90710 Acquired absence of both cervix and uterus: Secondary | ICD-10-CM

## 2022-07-31 DIAGNOSIS — D259 Leiomyoma of uterus, unspecified: Secondary | ICD-10-CM

## 2022-07-31 NOTE — Patient Instructions (Addendum)
It was great to see you today.  You are healing well from surgery.  Please remember, no heavy lifting for at least 6 weeks after surgery and nothing in the vagina for at least 10 weeks.    Please give me a call or send a MyChart message within the next 3-6 weeks to let me know about your urine.  I am hoping that with further time recovering from surgery and doing some Kegel exercises, this gets better.  Please make sure to follow-up with your primary care provider and OB/GYN as needed.

## 2022-08-03 ENCOUNTER — Encounter: Payer: Self-pay | Admitting: *Deleted

## 2022-08-21 ENCOUNTER — Telehealth: Payer: Self-pay

## 2022-08-21 NOTE — Telephone Encounter (Signed)
I spoke to patient to follow up from her previous visit with Dr. Pricilla Holm regarding her urinary symptoms. She states urinary symptoms are getting better and not having any pain.  She states she knows where to call if she has any more concerns and was thankful for the follow up call.

## 2022-08-25 DIAGNOSIS — D509 Iron deficiency anemia, unspecified: Secondary | ICD-10-CM | POA: Diagnosis not present

## 2022-09-03 DIAGNOSIS — D509 Iron deficiency anemia, unspecified: Secondary | ICD-10-CM | POA: Diagnosis not present

## 2022-09-25 DIAGNOSIS — D509 Iron deficiency anemia, unspecified: Secondary | ICD-10-CM | POA: Diagnosis not present

## 2022-10-16 ENCOUNTER — Other Ambulatory Visit (HOSPITAL_BASED_OUTPATIENT_CLINIC_OR_DEPARTMENT_OTHER): Payer: Self-pay

## 2022-10-16 MED ORDER — BISACODYL 5 MG PO TBEC
10.0000 mg | DELAYED_RELEASE_TABLET | Freq: Two times a day (BID) | ORAL | 0 refills | Status: DC
  Filled 2022-10-16 – 2022-11-27 (×2): qty 100, 25d supply, fill #0

## 2022-10-16 MED ORDER — PEG 3350-KCL-NA BICARB-NACL 420 G PO SOLR
ORAL | 0 refills | Status: DC
  Filled 2022-10-16 – 2022-11-27 (×2): qty 4000, 2d supply, fill #0

## 2022-10-27 ENCOUNTER — Other Ambulatory Visit (HOSPITAL_BASED_OUTPATIENT_CLINIC_OR_DEPARTMENT_OTHER): Payer: Self-pay

## 2022-11-09 DIAGNOSIS — K5900 Constipation, unspecified: Secondary | ICD-10-CM | POA: Diagnosis not present

## 2022-11-09 DIAGNOSIS — D649 Anemia, unspecified: Secondary | ICD-10-CM | POA: Diagnosis not present

## 2022-11-09 DIAGNOSIS — R195 Other fecal abnormalities: Secondary | ICD-10-CM | POA: Diagnosis not present

## 2022-11-27 ENCOUNTER — Other Ambulatory Visit: Payer: Self-pay

## 2022-11-27 ENCOUNTER — Other Ambulatory Visit (HOSPITAL_BASED_OUTPATIENT_CLINIC_OR_DEPARTMENT_OTHER): Payer: Self-pay

## 2022-12-02 ENCOUNTER — Other Ambulatory Visit (HOSPITAL_BASED_OUTPATIENT_CLINIC_OR_DEPARTMENT_OTHER): Payer: Self-pay

## 2022-12-02 DIAGNOSIS — K3189 Other diseases of stomach and duodenum: Secondary | ICD-10-CM | POA: Diagnosis not present

## 2022-12-02 DIAGNOSIS — R195 Other fecal abnormalities: Secondary | ICD-10-CM | POA: Diagnosis not present

## 2022-12-02 DIAGNOSIS — K2930 Chronic superficial gastritis without bleeding: Secondary | ICD-10-CM | POA: Diagnosis not present

## 2022-12-02 DIAGNOSIS — D509 Iron deficiency anemia, unspecified: Secondary | ICD-10-CM | POA: Diagnosis not present

## 2022-12-02 DIAGNOSIS — K31A12 Gastric intestinal metaplasia without dysplasia, involving the body (corpus): Secondary | ICD-10-CM | POA: Diagnosis not present

## 2022-12-02 DIAGNOSIS — B9681 Helicobacter pylori [H. pylori] as the cause of diseases classified elsewhere: Secondary | ICD-10-CM | POA: Diagnosis not present

## 2022-12-02 DIAGNOSIS — D125 Benign neoplasm of sigmoid colon: Secondary | ICD-10-CM | POA: Diagnosis not present

## 2022-12-02 DIAGNOSIS — K644 Residual hemorrhoidal skin tags: Secondary | ICD-10-CM | POA: Diagnosis not present

## 2022-12-02 DIAGNOSIS — K2940 Chronic atrophic gastritis without bleeding: Secondary | ICD-10-CM | POA: Diagnosis not present

## 2022-12-02 DIAGNOSIS — K648 Other hemorrhoids: Secondary | ICD-10-CM | POA: Diagnosis not present

## 2022-12-02 DIAGNOSIS — D649 Anemia, unspecified: Secondary | ICD-10-CM | POA: Diagnosis not present

## 2022-12-02 MED ORDER — ZEPBOUND 2.5 MG/0.5ML ~~LOC~~ SOAJ
2.5000 mg | SUBCUTANEOUS | 0 refills | Status: DC
  Filled 2022-12-02: qty 2, 28d supply, fill #0

## 2022-12-08 ENCOUNTER — Other Ambulatory Visit (HOSPITAL_BASED_OUTPATIENT_CLINIC_OR_DEPARTMENT_OTHER): Payer: Self-pay

## 2022-12-11 ENCOUNTER — Other Ambulatory Visit (HOSPITAL_BASED_OUTPATIENT_CLINIC_OR_DEPARTMENT_OTHER): Payer: Self-pay

## 2022-12-16 ENCOUNTER — Other Ambulatory Visit (HOSPITAL_BASED_OUTPATIENT_CLINIC_OR_DEPARTMENT_OTHER): Payer: Self-pay

## 2022-12-18 ENCOUNTER — Other Ambulatory Visit (HOSPITAL_BASED_OUTPATIENT_CLINIC_OR_DEPARTMENT_OTHER): Payer: Self-pay

## 2022-12-18 ENCOUNTER — Other Ambulatory Visit: Payer: Self-pay

## 2022-12-18 MED ORDER — AMOXICILLIN 500 MG PO CAPS
1000.0000 mg | ORAL_CAPSULE | Freq: Two times a day (BID) | ORAL | 0 refills | Status: DC
  Filled 2022-12-18: qty 40, 10d supply, fill #0

## 2022-12-18 MED ORDER — CLARITHROMYCIN 500 MG PO TABS
500.0000 mg | ORAL_TABLET | Freq: Two times a day (BID) | ORAL | 0 refills | Status: DC
  Filled 2022-12-18: qty 20, 10d supply, fill #0

## 2022-12-18 MED ORDER — PANTOPRAZOLE SODIUM 40 MG PO TBEC
40.0000 mg | DELAYED_RELEASE_TABLET | Freq: Two times a day (BID) | ORAL | 0 refills | Status: DC
  Filled 2022-12-18: qty 20, 10d supply, fill #0

## 2022-12-21 ENCOUNTER — Other Ambulatory Visit: Payer: Self-pay

## 2023-02-08 ENCOUNTER — Other Ambulatory Visit (HOSPITAL_BASED_OUTPATIENT_CLINIC_OR_DEPARTMENT_OTHER): Payer: Self-pay

## 2023-02-08 MED ORDER — INFLUENZA VIRUS VACC SPLIT PF (FLUZONE) 0.5 ML IM SUSY
0.5000 mL | PREFILLED_SYRINGE | Freq: Once | INTRAMUSCULAR | 0 refills | Status: AC
  Filled 2023-02-08: qty 0.5, 1d supply, fill #0

## 2023-02-17 ENCOUNTER — Other Ambulatory Visit (HOSPITAL_BASED_OUTPATIENT_CLINIC_OR_DEPARTMENT_OTHER): Payer: Self-pay

## 2023-02-17 DIAGNOSIS — Z1211 Encounter for screening for malignant neoplasm of colon: Secondary | ICD-10-CM | POA: Diagnosis not present

## 2023-02-17 DIAGNOSIS — Z1339 Encounter for screening examination for other mental health and behavioral disorders: Secondary | ICD-10-CM | POA: Diagnosis not present

## 2023-02-17 DIAGNOSIS — N941 Unspecified dyspareunia: Secondary | ICD-10-CM | POA: Diagnosis not present

## 2023-02-17 DIAGNOSIS — Z01419 Encounter for gynecological examination (general) (routine) without abnormal findings: Secondary | ICD-10-CM | POA: Diagnosis not present

## 2023-02-17 DIAGNOSIS — Z124 Encounter for screening for malignant neoplasm of cervix: Secondary | ICD-10-CM | POA: Diagnosis not present

## 2023-02-17 DIAGNOSIS — N952 Postmenopausal atrophic vaginitis: Secondary | ICD-10-CM | POA: Diagnosis not present

## 2023-02-17 DIAGNOSIS — Z1231 Encounter for screening mammogram for malignant neoplasm of breast: Secondary | ICD-10-CM | POA: Diagnosis not present

## 2023-02-17 DIAGNOSIS — Z9079 Acquired absence of other genital organ(s): Secondary | ICD-10-CM | POA: Diagnosis not present

## 2023-02-17 MED ORDER — ESTRADIOL 0.1 MG/GM VA CREA
TOPICAL_CREAM | VAGINAL | 6 refills | Status: AC
  Filled 2023-02-17: qty 42.5, 30d supply, fill #0
  Filled 2023-03-23: qty 42.5, 30d supply, fill #1
  Filled 2023-05-14: qty 42.5, 30d supply, fill #2
  Filled 2023-08-05: qty 42.5, 30d supply, fill #3
  Filled 2023-10-11: qty 42.5, 30d supply, fill #4
  Filled 2023-10-11 – 2023-11-24 (×3): qty 42.5, 30d supply, fill #0
  Filled 2023-12-03: qty 42.5, 90d supply, fill #0

## 2023-03-23 ENCOUNTER — Other Ambulatory Visit (HOSPITAL_BASED_OUTPATIENT_CLINIC_OR_DEPARTMENT_OTHER): Payer: Self-pay

## 2023-04-22 DIAGNOSIS — A048 Other specified bacterial intestinal infections: Secondary | ICD-10-CM | POA: Diagnosis not present

## 2023-04-22 DIAGNOSIS — B9681 Helicobacter pylori [H. pylori] as the cause of diseases classified elsewhere: Secondary | ICD-10-CM | POA: Diagnosis not present

## 2023-04-28 DIAGNOSIS — K219 Gastro-esophageal reflux disease without esophagitis: Secondary | ICD-10-CM | POA: Diagnosis not present

## 2023-04-28 DIAGNOSIS — D509 Iron deficiency anemia, unspecified: Secondary | ICD-10-CM | POA: Diagnosis not present

## 2023-04-28 DIAGNOSIS — Z0000 Encounter for general adult medical examination without abnormal findings: Secondary | ICD-10-CM | POA: Diagnosis not present

## 2023-04-28 DIAGNOSIS — E559 Vitamin D deficiency, unspecified: Secondary | ICD-10-CM | POA: Diagnosis not present

## 2023-04-28 DIAGNOSIS — Z13220 Encounter for screening for lipoid disorders: Secondary | ICD-10-CM | POA: Diagnosis not present

## 2023-05-08 ENCOUNTER — Telehealth: Payer: Commercial Managed Care - PPO | Admitting: Family Medicine

## 2023-05-08 DIAGNOSIS — J069 Acute upper respiratory infection, unspecified: Secondary | ICD-10-CM | POA: Diagnosis not present

## 2023-05-08 MED ORDER — FLUTICASONE PROPIONATE 50 MCG/ACT NA SUSP: 2.0000 | Freq: Every day | NASAL | 6 refills | Status: AC

## 2023-05-08 MED ORDER — BENZONATATE 200 MG PO CAPS: 200.0000 mg | ORAL_CAPSULE | Freq: Two times a day (BID) | ORAL | 0 refills | Status: DC | PRN

## 2023-05-08 NOTE — Progress Notes (Signed)

## 2023-05-10 ENCOUNTER — Telehealth: Payer: Commercial Managed Care - PPO | Admitting: Physician Assistant

## 2023-05-10 DIAGNOSIS — B9689 Other specified bacterial agents as the cause of diseases classified elsewhere: Secondary | ICD-10-CM

## 2023-05-10 MED ORDER — AMOXICILLIN-POT CLAVULANATE 875-125 MG PO TABS
1.0000 | ORAL_TABLET | Freq: Two times a day (BID) | ORAL | 0 refills | Status: DC
Start: 2023-05-10 — End: 2024-02-08

## 2023-05-10 NOTE — Progress Notes (Signed)

## 2023-05-14 ENCOUNTER — Other Ambulatory Visit (HOSPITAL_BASED_OUTPATIENT_CLINIC_OR_DEPARTMENT_OTHER): Payer: Self-pay

## 2023-07-13 DIAGNOSIS — R0683 Snoring: Secondary | ICD-10-CM | POA: Diagnosis not present

## 2023-07-13 DIAGNOSIS — Z1331 Encounter for screening for depression: Secondary | ICD-10-CM | POA: Diagnosis not present

## 2023-07-13 DIAGNOSIS — E66812 Obesity, class 2: Secondary | ICD-10-CM | POA: Diagnosis not present

## 2023-07-13 DIAGNOSIS — Z9189 Other specified personal risk factors, not elsewhere classified: Secondary | ICD-10-CM | POA: Diagnosis not present

## 2023-07-13 DIAGNOSIS — E6609 Other obesity due to excess calories: Secondary | ICD-10-CM | POA: Diagnosis not present

## 2023-07-13 DIAGNOSIS — Z6839 Body mass index (BMI) 39.0-39.9, adult: Secondary | ICD-10-CM | POA: Diagnosis not present

## 2023-07-15 DIAGNOSIS — H40013 Open angle with borderline findings, low risk, bilateral: Secondary | ICD-10-CM | POA: Diagnosis not present

## 2023-07-15 DIAGNOSIS — H43812 Vitreous degeneration, left eye: Secondary | ICD-10-CM | POA: Diagnosis not present

## 2023-07-16 DIAGNOSIS — H40013 Open angle with borderline findings, low risk, bilateral: Secondary | ICD-10-CM | POA: Diagnosis not present

## 2023-07-16 DIAGNOSIS — H43812 Vitreous degeneration, left eye: Secondary | ICD-10-CM | POA: Diagnosis not present

## 2023-07-27 DIAGNOSIS — E6609 Other obesity due to excess calories: Secondary | ICD-10-CM | POA: Diagnosis not present

## 2023-07-27 DIAGNOSIS — Z9189 Other specified personal risk factors, not elsewhere classified: Secondary | ICD-10-CM | POA: Diagnosis not present

## 2023-07-27 DIAGNOSIS — R0683 Snoring: Secondary | ICD-10-CM | POA: Diagnosis not present

## 2023-07-27 DIAGNOSIS — E66812 Obesity, class 2: Secondary | ICD-10-CM | POA: Diagnosis not present

## 2023-07-27 DIAGNOSIS — Z6839 Body mass index (BMI) 39.0-39.9, adult: Secondary | ICD-10-CM | POA: Diagnosis not present

## 2023-08-05 ENCOUNTER — Other Ambulatory Visit (HOSPITAL_BASED_OUTPATIENT_CLINIC_OR_DEPARTMENT_OTHER): Payer: Self-pay

## 2023-08-05 DIAGNOSIS — H35461 Secondary vitreoretinal degeneration, right eye: Secondary | ICD-10-CM | POA: Diagnosis not present

## 2023-08-05 DIAGNOSIS — H31092 Other chorioretinal scars, left eye: Secondary | ICD-10-CM | POA: Diagnosis not present

## 2023-08-05 DIAGNOSIS — H43813 Vitreous degeneration, bilateral: Secondary | ICD-10-CM | POA: Diagnosis not present

## 2023-08-05 DIAGNOSIS — H4312 Vitreous hemorrhage, left eye: Secondary | ICD-10-CM | POA: Diagnosis not present

## 2023-09-10 DIAGNOSIS — H31092 Other chorioretinal scars, left eye: Secondary | ICD-10-CM | POA: Diagnosis not present

## 2023-09-10 DIAGNOSIS — H43813 Vitreous degeneration, bilateral: Secondary | ICD-10-CM | POA: Diagnosis not present

## 2023-09-10 DIAGNOSIS — H4312 Vitreous hemorrhage, left eye: Secondary | ICD-10-CM | POA: Diagnosis not present

## 2023-09-10 DIAGNOSIS — H35461 Secondary vitreoretinal degeneration, right eye: Secondary | ICD-10-CM | POA: Diagnosis not present

## 2023-10-05 DIAGNOSIS — R0683 Snoring: Secondary | ICD-10-CM | POA: Diagnosis not present

## 2023-10-05 DIAGNOSIS — Z6839 Body mass index (BMI) 39.0-39.9, adult: Secondary | ICD-10-CM | POA: Diagnosis not present

## 2023-10-05 DIAGNOSIS — E6609 Other obesity due to excess calories: Secondary | ICD-10-CM | POA: Diagnosis not present

## 2023-10-05 DIAGNOSIS — E66812 Obesity, class 2: Secondary | ICD-10-CM | POA: Diagnosis not present

## 2023-10-11 ENCOUNTER — Other Ambulatory Visit (HOSPITAL_BASED_OUTPATIENT_CLINIC_OR_DEPARTMENT_OTHER): Payer: Self-pay

## 2023-10-11 ENCOUNTER — Other Ambulatory Visit (HOSPITAL_COMMUNITY): Payer: Self-pay

## 2023-10-21 ENCOUNTER — Other Ambulatory Visit (HOSPITAL_BASED_OUTPATIENT_CLINIC_OR_DEPARTMENT_OTHER): Payer: Self-pay

## 2023-10-21 ENCOUNTER — Other Ambulatory Visit (HOSPITAL_COMMUNITY): Payer: Self-pay

## 2023-10-28 DIAGNOSIS — E6609 Other obesity due to excess calories: Secondary | ICD-10-CM | POA: Diagnosis not present

## 2023-10-28 DIAGNOSIS — Z6839 Body mass index (BMI) 39.0-39.9, adult: Secondary | ICD-10-CM | POA: Diagnosis not present

## 2023-10-28 DIAGNOSIS — E66812 Obesity, class 2: Secondary | ICD-10-CM | POA: Diagnosis not present

## 2023-11-24 ENCOUNTER — Encounter: Payer: Self-pay | Admitting: Pharmacist

## 2023-11-24 ENCOUNTER — Other Ambulatory Visit: Payer: Self-pay

## 2023-11-24 ENCOUNTER — Other Ambulatory Visit (HOSPITAL_COMMUNITY): Payer: Self-pay

## 2023-11-24 ENCOUNTER — Other Ambulatory Visit (HOSPITAL_BASED_OUTPATIENT_CLINIC_OR_DEPARTMENT_OTHER): Payer: Self-pay

## 2023-11-25 DIAGNOSIS — D509 Iron deficiency anemia, unspecified: Secondary | ICD-10-CM | POA: Diagnosis not present

## 2023-11-25 DIAGNOSIS — E6609 Other obesity due to excess calories: Secondary | ICD-10-CM | POA: Diagnosis not present

## 2023-11-25 DIAGNOSIS — E66812 Obesity, class 2: Secondary | ICD-10-CM | POA: Diagnosis not present

## 2023-11-25 DIAGNOSIS — Z6839 Body mass index (BMI) 39.0-39.9, adult: Secondary | ICD-10-CM | POA: Diagnosis not present

## 2023-11-29 ENCOUNTER — Other Ambulatory Visit: Payer: Self-pay

## 2023-12-03 ENCOUNTER — Other Ambulatory Visit (HOSPITAL_BASED_OUTPATIENT_CLINIC_OR_DEPARTMENT_OTHER): Payer: Self-pay

## 2023-12-17 ENCOUNTER — Other Ambulatory Visit (HOSPITAL_BASED_OUTPATIENT_CLINIC_OR_DEPARTMENT_OTHER): Payer: Self-pay

## 2023-12-17 DIAGNOSIS — U071 COVID-19: Secondary | ICD-10-CM | POA: Diagnosis not present

## 2023-12-17 DIAGNOSIS — B349 Viral infection, unspecified: Secondary | ICD-10-CM | POA: Diagnosis not present

## 2023-12-17 MED ORDER — PAXLOVID (300/100) 20 X 150 MG & 10 X 100MG PO TBPK
3.0000 | ORAL_TABLET | Freq: Two times a day (BID) | ORAL | 0 refills | Status: DC
  Filled 2023-12-17: qty 30, 5d supply, fill #0

## 2023-12-17 MED ORDER — PROMETHAZINE-DM 6.25-15 MG/5ML PO SYRP
5.0000 mL | ORAL_SOLUTION | Freq: Four times a day (QID) | ORAL | 0 refills | Status: AC | PRN
  Filled 2023-12-17: qty 200, 10d supply, fill #0

## 2023-12-20 ENCOUNTER — Other Ambulatory Visit: Payer: Self-pay | Admitting: Obstetrics and Gynecology

## 2023-12-20 DIAGNOSIS — Z1231 Encounter for screening mammogram for malignant neoplasm of breast: Secondary | ICD-10-CM

## 2024-01-03 DIAGNOSIS — E6609 Other obesity due to excess calories: Secondary | ICD-10-CM | POA: Diagnosis not present

## 2024-01-03 DIAGNOSIS — D509 Iron deficiency anemia, unspecified: Secondary | ICD-10-CM | POA: Diagnosis not present

## 2024-01-03 DIAGNOSIS — E66812 Obesity, class 2: Secondary | ICD-10-CM | POA: Diagnosis not present

## 2024-01-03 DIAGNOSIS — Z6839 Body mass index (BMI) 39.0-39.9, adult: Secondary | ICD-10-CM | POA: Diagnosis not present

## 2024-01-19 ENCOUNTER — Other Ambulatory Visit (HOSPITAL_BASED_OUTPATIENT_CLINIC_OR_DEPARTMENT_OTHER): Payer: Self-pay

## 2024-01-19 DIAGNOSIS — D649 Anemia, unspecified: Secondary | ICD-10-CM | POA: Diagnosis not present

## 2024-01-19 DIAGNOSIS — R21 Rash and other nonspecific skin eruption: Secondary | ICD-10-CM | POA: Diagnosis not present

## 2024-01-19 MED ORDER — PREDNISONE 20 MG PO TABS
ORAL_TABLET | ORAL | 0 refills | Status: AC
  Filled 2024-01-19: qty 9, 6d supply, fill #0

## 2024-01-22 ENCOUNTER — Other Ambulatory Visit: Payer: Self-pay

## 2024-01-22 ENCOUNTER — Emergency Department (HOSPITAL_COMMUNITY)

## 2024-01-22 ENCOUNTER — Emergency Department (HOSPITAL_COMMUNITY)
Admission: EM | Admit: 2024-01-22 | Discharge: 2024-01-22 | Disposition: A | Discharge status: Home / Self Care | Attending: Emergency Medicine | Admitting: Emergency Medicine

## 2024-01-22 ENCOUNTER — Encounter (HOSPITAL_COMMUNITY): Payer: Self-pay

## 2024-01-22 DIAGNOSIS — E876 Hypokalemia: Secondary | ICD-10-CM | POA: Diagnosis not present

## 2024-01-22 DIAGNOSIS — K449 Diaphragmatic hernia without obstruction or gangrene: Secondary | ICD-10-CM | POA: Diagnosis not present

## 2024-01-22 DIAGNOSIS — R079 Chest pain, unspecified: Secondary | ICD-10-CM | POA: Diagnosis not present

## 2024-01-22 DIAGNOSIS — D649 Anemia, unspecified: Secondary | ICD-10-CM | POA: Diagnosis not present

## 2024-01-22 DIAGNOSIS — F17290 Nicotine dependence, other tobacco product, uncomplicated: Secondary | ICD-10-CM | POA: Diagnosis not present

## 2024-01-22 DIAGNOSIS — J9811 Atelectasis: Secondary | ICD-10-CM | POA: Diagnosis not present

## 2024-01-22 DIAGNOSIS — R1013 Epigastric pain: Secondary | ICD-10-CM | POA: Diagnosis not present

## 2024-01-22 LAB — BASIC METABOLIC PANEL WITH GFR
Anion gap: 10 (ref 5–15)
BUN: 18 mg/dL (ref 6–20)
CO2: 26 mmol/L (ref 22–32)
Calcium: 9.3 mg/dL (ref 8.9–10.3)
Chloride: 105 mmol/L (ref 98–111)
Creatinine, Ser: 0.69 mg/dL (ref 0.44–1.00)
GFR, Estimated: >60 mL/min (ref >60)
Glucose, Bld: 101 mg/dL — ABNORMAL HIGH (ref 70–99)
Potassium: 3.2 mmol/L — ABNORMAL LOW (ref 3.5–5.1)
Sodium: 141 mmol/L (ref 135–145)

## 2024-01-22 LAB — CBC WITH DIFFERENTIAL/PLATELET
Abs Immature Granulocytes: 0.04 K/uL (ref 0.00–0.07)
Basophils Absolute: 0 K/uL (ref 0.0–0.1)
Basophils Relative: 0 %
Eosinophils Absolute: 0 K/uL (ref 0.0–0.5)
Eosinophils Relative: 0 %
HCT: 37.1 % (ref 36.0–46.0)
Hemoglobin: 11 g/dL — ABNORMAL LOW (ref 12.0–15.0)
Immature Granulocytes: 0 %
Lymphocytes Relative: 34 %
Lymphs Abs: 3.1 K/uL (ref 0.7–4.0)
MCH: 27.4 pg (ref 26.0–34.0)
MCHC: 29.6 g/dL — ABNORMAL LOW (ref 30.0–36.0)
MCV: 92.3 fL (ref 80.0–100.0)
Monocytes Absolute: 0.4 K/uL (ref 0.1–1.0)
Monocytes Relative: 4 %
Neutro Abs: 5.6 K/uL (ref 1.7–7.7)
Neutrophils Relative %: 62 %
Platelets: 284 K/uL (ref 150–400)
RBC: 4.02 MIL/uL (ref 3.87–5.11)
RDW: 13.9 % (ref 11.5–15.5)
WBC: 9.1 K/uL (ref 4.0–10.5)
nRBC: 0 % (ref 0.0–0.2)

## 2024-01-22 LAB — HEPATIC FUNCTION PANEL
ALT: 10 U/L (ref 0–44)
AST: 18 U/L (ref 15–41)
Albumin: 4 g/dL (ref 3.5–5.0)
Alkaline Phosphatase: 79 U/L (ref 38–126)
Bilirubin, Direct: 0.2 mg/dL (ref 0.0–0.2)
Indirect Bilirubin: 0.2 mg/dL — ABNORMAL LOW (ref 0.3–0.9)
Total Bilirubin: 0.4 mg/dL (ref 0.0–1.2)
Total Protein: 7 g/dL (ref 6.5–8.1)

## 2024-01-22 LAB — TROPONIN T, HIGH SENSITIVITY
Troponin T High Sensitivity: <15 ng/L (ref 0–19)
Troponin T High Sensitivity: <15 ng/L (ref 0–19)

## 2024-01-22 LAB — MAGNESIUM: Magnesium: 2.1 mg/dL (ref 1.7–2.4)

## 2024-01-22 LAB — LIPASE, BLOOD: Lipase: 32 U/L (ref 11–51)

## 2024-01-22 MED ORDER — POTASSIUM CHLORIDE 20 MEQ PO PACK
40.0000 meq | PACK | Freq: Once | ORAL | Status: AC
  Administered 2024-01-22: 40 meq via ORAL
  Filled 2024-01-22: qty 2

## 2024-01-22 MED ORDER — ASPIRIN 81 MG PO CHEW
324.0000 mg | CHEWABLE_TABLET | Freq: Once | ORAL | Status: AC
  Administered 2024-01-22: 324 mg via ORAL
  Filled 2024-01-22: qty 4

## 2024-01-22 MED ORDER — OXYCODONE-ACETAMINOPHEN 5-325 MG PO TABS
1.0000 | ORAL_TABLET | Freq: Once | ORAL | Status: AC
  Administered 2024-01-22: 1 via ORAL
  Filled 2024-01-22: qty 1

## 2024-01-22 MED ORDER — PANTOPRAZOLE SODIUM 20 MG PO TBEC
20.0000 mg | DELAYED_RELEASE_TABLET | Freq: Two times a day (BID) | ORAL | 2 refills | Status: DC
Start: 2024-01-22 — End: 2024-03-02

## 2024-01-22 MED ORDER — IOHEXOL 350 MG/ML SOLN
80.0000 mL | Freq: Once | INTRAVENOUS | Status: AC | PRN
  Administered 2024-01-22: 75 mL via INTRAVENOUS

## 2024-01-22 MED ORDER — LIDOCAINE VISCOUS HCL 2 % MT SOLN
15.0000 mL | Freq: Once | OROMUCOSAL | Status: AC
  Administered 2024-01-22: 15 mL via ORAL
  Filled 2024-01-22: qty 15

## 2024-01-22 MED ORDER — ALUM & MAG HYDROXIDE-SIMETH 200-200-20 MG/5ML PO SUSP
30.0000 mL | Freq: Once | ORAL | Status: AC
  Administered 2024-01-22: 30 mL via ORAL
  Filled 2024-01-22: qty 30

## 2024-01-22 NOTE — Discharge Instructions (Addendum)
 Your test results today are reassuring.  A prescription for a medication called pantoprazole  was sent to your pharmacy.  Take this daily.  You can also take Maalox or Mylanta as needed for chest discomfort.  Avoid foods that worsen your symptoms.  Follow-up with your GI doctor and primary care doctor.  Return to the emergency department for any new or worsening symptoms of concern.

## 2024-01-22 NOTE — ED Triage Notes (Signed)
 Pt reports on and off chest pain/ epigastric pain. Antiacid and Zantac used during the week with some relief, however today no relief with antiacids. Hurts to swallow. Pt has the urge to belch but can not similar feeling to heart burn.

## 2024-01-22 NOTE — ED Provider Notes (Signed)
 [place] EMERGENCY DEPARTMENT AT [facility] Provider Note   CSN: [id number] Arrival date & time: 01/22/24  1250     Patient presents with: Chest Pain   Sarah Villa is a 58 y.o. female.    Chest Pain Patient presents for chest pain.  Medical history includes anemia.  She was recently treated with prednisone for some areas of urticaria.  This has since resolved.  Over the past week, she has had intermittent lower chest/epigastric discomfort.  This morning at 5 AM, she had recurrence of this discomfort which was more severe in nature.  She tried treating with antacids and Xanax with no relief.  She went to [facility] physicians walk-in clinic and was sent to the ED for further evaluation.  Patient has no known cardiac risk factors.  She is a never smoker.  Current pain is 8/10 in severity.  She denies any other associated symptoms.     Prior to Admission medications   Medication Sig Start Date End Date Taking? Authorizing Provider  pantoprazole  (PROTONIX ) 20 MG tablet Take 1 tablet (20 mg total) by mouth 2 (two) times daily. 01/22/24  Yes Melvenia Motto, MD  amoxicillin -clavulanate (AUGMENTIN ) 875-125 MG tablet Take 1 tablet by mouth 2 (two) times daily. 05/10/23   Vivienne Delon HERO, PA-C  b complex vitamins capsule Take 1 capsule by mouth daily.    [provider]  benzonatate  (TESSALON ) 200 MG capsule Take 1 capsule (200 mg total) by mouth 2 (two) times daily as needed for cough. 05/08/23   Blair, Diane W, FNP  Cholecalciferol (VITAMIN D3) 50 MCG (2000 UT) capsule Take 2,000 Units by mouth daily.    [provider]  estradiol  (ESTRACE ) 0.1 MG/GM vaginal cream Place 1 gram in vagina every other day for 14 days then twice a week for maintenance 02/17/23     ferrous sulfate  325 (65 FE) MG tablet Take 1 tablet by mouth once daily. 04/08/22     fluticasone  (FLONASE ) 50 MCG/ACT nasal spray Place 2 sprays into both nostrils daily. 05/08/23   Blair, Diane W, FNP   nirmatrelvir /ritonavir  (PAXLOVID , 300/100,) 20 x 150 MG & 10 x 100MG  TBPK Take 3 tablets by mouth 2 (two) times daily. 12/17/23     predniSONE (DELTASONE) 20 MG tablet Take 2 tablets (40 mg total) by mouth daily for 3 days, THEN 1 tablet (20 mg total) daily for 3 days. 01/19/24 01/25/24    promethazine -dextromethorphan (PROMETHAZINE -DM) 6.25-15 MG/5ML syrup Take 5 mLs by mouth every 6 (six) hours as needed for cough 12/17/23       Allergies: Patient has no known allergies.    Review of Systems  Cardiovascular:  Positive for chest pain.  All other systems reviewed and are negative.   Updated Vital Signs BP 107/68   Pulse 94   Temp 98 F (36.7 C) (Oral)   Resp 20   LMP 02/14/2017   SpO2 97%   Physical Exam Vitals and nursing note reviewed.  Constitutional:      General: She is not in acute distress.    Appearance: She is well-developed. She is not ill-appearing, toxic-appearing or diaphoretic.  HENT:     Head: Normocephalic and atraumatic.  Eyes:     Extraocular Movements: Extraocular movements intact.     Conjunctiva/sclera: Conjunctivae normal.  Cardiovascular:     Rate and Rhythm: Normal rate and regular rhythm.     Heart sounds: No murmur heard. Pulmonary:     Effort: Pulmonary effort is normal. No  tachypnea or respiratory distress.     Breath sounds: Normal breath sounds. No decreased breath sounds, wheezing, rhonchi or rales.  Chest:     Chest wall: No tenderness.  Abdominal:     Palpations: Abdomen is soft.     Tenderness: There is abdominal tenderness (Epigastric).  Musculoskeletal:        General: No swelling. Normal range of motion.     Cervical back: Normal range of motion and neck supple.     Right lower leg: No edema.     Left lower leg: No edema.  Skin:    General: Skin is warm and dry.     Coloration: Skin is not cyanotic or pale.  Neurological:     General: No focal deficit present.     Mental Status: She is alert and oriented to person, place, and time.   Psychiatric:        Mood and Affect: Mood normal.        Behavior: Behavior normal.     (all labs ordered are listed, but only abnormal results are displayed) Labs Reviewed  BASIC METABOLIC PANEL WITH GFR - Abnormal; Notable for the following components:      Result Value   Potassium 3.2 (*)    Glucose, Bld 101 (*)    All other components within normal limits  CBC WITH DIFFERENTIAL/PLATELET - Abnormal; Notable for the following components:   Hemoglobin 11.0 (*)    MCHC 29.6 (*)    All other components within normal limits  HEPATIC FUNCTION PANEL - Abnormal; Notable for the following components:   Indirect Bilirubin 0.2 (*)    All other components within normal limits  MAGNESIUM  LIPASE, BLOOD  TROPONIN T, HIGH SENSITIVITY  TROPONIN T, HIGH SENSITIVITY    EKG: EKG Interpretation Date/Time:  Saturday January 22 2024 14:08:37 EDT Ventricular Rate:  83 PR Interval:  178 QRS Duration:  98 QT Interval:  366 QTC Calculation: 430 R Axis:   -12  Text Interpretation: Sinus rhythm Ventricular premature complex Low voltage, precordial leads Confirmed by Ula Barter [phone]) on 01/22/2024 2:16:33 PM  Radiology: CT Angio Chest PE W and/or Wo Contrast Result Date: 01/22/2024 CLINICAL DATA:  Suspected pulmonary embolism. EXAM: CT ANGIOGRAPHY CHEST WITH CONTRAST TECHNIQUE: Multidetector CT imaging of the chest was performed using the standard protocol during bolus administration of intravenous contrast. Multiplanar CT image reconstructions and MIPs were obtained to evaluate the vascular anatomy. RADIATION DOSE REDUCTION: This exam was performed according to the departmental dose-optimization program which includes automated exposure control, adjustment of the mA and/or kV according to patient size and/or use of iterative reconstruction technique. CONTRAST:  75mL OMNIPAQUE IOHEXOL 350 MG/ML SOLN COMPARISON:  None Available. FINDINGS: Cardiovascular: Satisfactory opacification of the pulmonary  arteries to the segmental level. No evidence of pulmonary embolism. Normal heart size. No pericardial effusion. Mediastinum/Nodes: No enlarged mediastinal, hilar, or axillary lymph nodes. Thyroid  gland, trachea, and esophagus demonstrate no significant findings. Lungs/Pleura: There is mild lingular and mild bibasilar linear atelectasis. No acute infiltrate, pleural effusion or pneumothorax is identified. Upper Abdomen: There is a small hiatal hernia. Musculoskeletal: No chest wall abnormality. No acute or significant osseous findings. Review of the MIP images confirms the above findings. IMPRESSION: 1. No evidence of pulmonary embolism or other acute intrathoracic process. 2. Mild lingular and mild bibasilar linear atelectasis. 3. Small hiatal hernia. Electronically Signed   By: Suzen Dials M.D.   On: 01/22/2024 17:45   DG Chest Portable 1 View Result Date:  01/22/2024 CLINICAL DATA:  Intermittent chest pain and epigastric pain. EXAM: PORTABLE CHEST 1 VIEW COMPARISON:  December 20, 2013 FINDINGS: The heart size and mediastinal contours are within normal limits. There is mild left infrahilar linear scarring and/or atelectasis. No acute infiltrate, pleural effusion or pneumothorax is identified. The visualized skeletal structures are unremarkable. IMPRESSION: No active disease. Electronically Signed   By: Suzen Dials M.D.   On: 01/22/2024 14:14     Procedures   Medications Ordered in the ED  aspirin chewable tablet 324 mg (324 mg Oral Given 01/22/24 1412)  oxyCODONE -acetaminophen  (PERCOCET/ROXICET) 5-325 MG per tablet 1 tablet (1 tablet Oral Given 01/22/24 1410)  alum & mag hydroxide-simeth (MAALOX/MYLANTA) 200-200-20 MG/5ML suspension 30 mL (30 mLs Oral Given 01/22/24 1409)    And  lidocaine  (XYLOCAINE ) 2 % viscous mouth solution 15 mL (15 mLs Oral Given 01/22/24 1409)  potassium chloride (KLOR-CON) packet 40 mEq (40 mEq Oral Given 01/22/24 1549)  iohexol (OMNIPAQUE) 350 MG/ML injection 80  mL (75 mLs Intravenous Contrast Given 01/22/24 1726)                                    Medical Decision Making Amount and/or Complexity of Data Reviewed Labs: ordered. Radiology: ordered.  Risk OTC drugs. Prescription drug management.   This patient presents to the ED for concern of chest pain, this involves an extensive number of treatment options, and is a complaint that carries with it a high risk of complications and morbidity.  The differential diagnosis includes ACS, pericarditis, GERD, PE, musculoskeletal etiology   Co morbidities / Chronic conditions that complicate the patient evaluation  Anemia   Additional history obtained:  Additional history obtained from EMR External records from outside source obtained and reviewed including N/A   Lab Tests:  I Ordered, and personally interpreted labs.  The pertinent results include: Mild hypokalemia with otherwise normal electrolytes, no leukocytosis, baseline anemia, normal troponin x 2   Imaging Studies ordered:  I ordered imaging studies including chest x-ray, CTA chest I independently visualized and interpreted imaging which showed no acute findings.  Small hiatal hernia present, likely contributing to reflux I agree with the radiologist interpretation   Cardiac Monitoring: / EKG:  The patient was maintained on a cardiac monitor.  I personally viewed and interpreted the cardiac monitored which showed an underlying rhythm of: Sinus rhythm   Problem List / ED Course / Critical interventions / Medication management  Patient presenting for lower chest/epigastric discomfort.  This has been intermittent over the past week but worsened in severity this morning.  Since 5 AM, discomfort has been constant.  On arrival in the ED, vital signs are normal and patient is well-appearing.  She does have some mild epigastric tenderness.  EKG shows no concerning ST segment abnormalities.  ASA was given and cardiac workup was  initiated.  Patient has had a recent course of prednisone and this may predispose her to a gastritis.  Alternatively, she is on a GLP-1 agonist which can contribute to pancreatitis and/or gastroparesis.  Gastroparesis unlikely with absence of nausea.  Percocet and GI cocktail were ordered for analgesia.  Patient's initial lab work notable for hypokalemia.  Replacement potassium was ordered.  Patient did have improved symptoms while in the ED.  She underwent CT of chest which did not show any acute findings.  She does have a hiatal hernia which is likely contributing to reflux etiology of  her symptoms.  Patient was prescribed PPI and advised to follow-up with her gastroenterologist.  Patient was discharged in stable condition. I ordered medication including ASA for chest pain; Percocet and GI cocktail for analgesia; potassium chloride for hypokalemia Reevaluation of the patient after these medicines showed that the patient improved I have reviewed the patients home medicines and have made adjustments as needed  Social Determinants of Health:  Lives independently     Final diagnoses:  Chest pain, unspecified type  Hiatal hernia    ED Discharge Orders          Ordered    pantoprazole  (PROTONIX ) 20 MG tablet  2 times daily        01/22/24 1821               Melvenia Motto, MD 01/22/24 1823

## 2024-01-25 DIAGNOSIS — K219 Gastro-esophageal reflux disease without esophagitis: Secondary | ICD-10-CM | POA: Diagnosis not present

## 2024-01-25 DIAGNOSIS — E6609 Other obesity due to excess calories: Secondary | ICD-10-CM | POA: Diagnosis not present

## 2024-01-25 DIAGNOSIS — L282 Other prurigo: Secondary | ICD-10-CM | POA: Diagnosis not present

## 2024-01-25 DIAGNOSIS — K449 Diaphragmatic hernia without obstruction or gangrene: Secondary | ICD-10-CM | POA: Diagnosis not present

## 2024-01-26 ENCOUNTER — Other Ambulatory Visit (HOSPITAL_BASED_OUTPATIENT_CLINIC_OR_DEPARTMENT_OTHER): Payer: Self-pay

## 2024-01-26 DIAGNOSIS — R1310 Dysphagia, unspecified: Secondary | ICD-10-CM | POA: Diagnosis not present

## 2024-01-26 DIAGNOSIS — D509 Iron deficiency anemia, unspecified: Secondary | ICD-10-CM | POA: Diagnosis not present

## 2024-01-26 DIAGNOSIS — K5900 Constipation, unspecified: Secondary | ICD-10-CM | POA: Diagnosis not present

## 2024-01-26 DIAGNOSIS — R0789 Other chest pain: Secondary | ICD-10-CM | POA: Diagnosis not present

## 2024-01-26 MED ORDER — PANTOPRAZOLE SODIUM 20 MG PO TBEC
20.0000 mg | DELAYED_RELEASE_TABLET | Freq: Two times a day (BID) | ORAL | 3 refills | Status: AC
  Filled 2024-01-26 – 2024-02-25 (×2): qty 30, 15d supply, fill #0
  Filled 2024-03-29: qty 30, 15d supply, fill #1
  Filled 2024-04-18: qty 30, 15d supply, fill #2
  Filled 2024-04-28 – 2024-05-01 (×2): qty 30, 15d supply, fill #3

## 2024-01-31 ENCOUNTER — Other Ambulatory Visit (HOSPITAL_BASED_OUTPATIENT_CLINIC_OR_DEPARTMENT_OTHER): Payer: Self-pay

## 2024-01-31 DIAGNOSIS — T783XXA Angioneurotic edema, initial encounter: Secondary | ICD-10-CM | POA: Diagnosis not present

## 2024-01-31 MED ORDER — PREDNISONE 20 MG PO TABS
ORAL_TABLET | ORAL | 0 refills | Status: DC
  Filled 2024-01-31: qty 17, 8d supply, fill #0

## 2024-01-31 MED ORDER — EPINEPHRINE 0.3 MG/0.3ML IJ SOAJ
INTRAMUSCULAR | 3 refills | Status: AC
  Filled 2024-01-31: qty 2, 30d supply, fill #0

## 2024-02-03 ENCOUNTER — Other Ambulatory Visit (HOSPITAL_BASED_OUTPATIENT_CLINIC_OR_DEPARTMENT_OTHER): Payer: Self-pay

## 2024-02-03 DIAGNOSIS — D509 Iron deficiency anemia, unspecified: Secondary | ICD-10-CM

## 2024-02-05 ENCOUNTER — Ambulatory Visit (HOSPITAL_BASED_OUTPATIENT_CLINIC_OR_DEPARTMENT_OTHER)
Admission: RE | Admit: 2024-02-05 | Discharge: 2024-02-05 | Disposition: A | Discharge status: Home / Self Care | Source: Ambulatory Visit

## 2024-02-05 DIAGNOSIS — D509 Iron deficiency anemia, unspecified: Secondary | ICD-10-CM | POA: Diagnosis not present

## 2024-02-05 MED ORDER — IOHEXOL 300 MG/ML  SOLN
100.0000 mL | Freq: Once | INTRAMUSCULAR | Status: AC | PRN
  Administered 2024-02-05: 100 mL via INTRAVENOUS

## 2024-02-07 NOTE — Progress Notes (Unsigned)
 New Patient Note  RE: Sarah Villa MRN: [id number] DOB: 05-05-1965 Date of Office Visit: 02/08/2024  Consult requested by: Teresa Channel, MD Primary care provider: Teresa Channel, MD  Chief Complaint: No chief complaint on file.  History of Present Illness: I had the pleasure of seeing Sarah Villa for initial evaluation at the [facility] on 02/08/2024. She is a 58 y.o. female, who is referred here by Teresa Channel, MD for the evaluation of ***.  Discussed the use of AI scribe software for clinical note transcription with the patient, who gave verbal consent to proceed.  History of Present Illness             ***  Assessment and Plan: Sarah Villa is a 58 y.o. female with: ***  Assessment and Plan               No follow-ups on file.  No orders of the defined types were placed in this encounter.  Lab Orders  No laboratory test(s) ordered today    Other allergy screening: Asthma: {Blank single:19197::yes,no} Rhino conjunctivitis: {Blank single:19197::yes,no} Food allergy: {Blank single:19197::yes,no} Medication allergy: {Blank single:19197::yes,no} Hymenoptera allergy: {Blank single:19197::yes,no} Urticaria: {Blank single:19197::yes,no} Eczema:{Blank single:19197::yes,no} History of recurrent infections suggestive of immunodeficency: {Blank single:19197::yes,no}  Diagnostics: Spirometry:  Tracings reviewed. Her effort: {Blank single:19197::Good reproducible efforts.,It was hard to get consistent efforts and there is a question as to whether this reflects a maximal maneuver.,Poor effort, data can not be interpreted.} FVC: ***L FEV1: ***L, ***% predicted FEV1/FVC ratio: ***% Interpretation: {Blank single:19197::Spirometry consistent with mild obstructive disease,Spirometry consistent with moderate obstructive disease,Spirometry consistent with severe obstructive disease,Spirometry consistent with  possible restrictive disease,Spirometry consistent with mixed obstructive and restrictive disease,Spirometry uninterpretable due to technique,Spirometry consistent with normal pattern,No overt abnormalities noted given today's efforts}.  Please see scanned spirometry results for details.  Skin Testing: {Blank single:19197::Select foods,Environmental allergy panel,Environmental allergy panel and select foods,Food allergy panel,None,Deferred due to recent antihistamines use}. *** Results discussed with patient/family.   Past Medical History: Patient Active Problem List   Diagnosis Date Noted  . Pelvic mass 07/01/2022  . Uterine leiomyoma 07/01/2022  . Vitamin D deficiency 05/11/2019  . Depression 05/11/2019  . Class 1 obesity with serious comorbidity and body mass index (BMI) of 33.0 to 33.9 in adult 05/11/2019  . Murmur, cardiac 11/01/2014  . Fibroids, submucosal 02/19/2014  . Breast density 02/03/2012  . History of ectopic pregnancy   . Menorrhagia   . History of infertility, female   . Yeast infection   . Anemia    Past Medical History:  Diagnosis Date  . Anemia   . Anemia   . B12 deficiency   . Chlamydia   . Family history of adverse reaction to anesthesia    Mom nausea  . Heart murmur   . Herpes   . History of ectopic pregnancy   . History of infertility, female   . Infertility, female   . Menorrhagia   . Obesity   . Yeast infection    Past Surgical History: Past Surgical History:  Procedure Laterality Date  . COLONOSCOPY    . DILATATION & CURETTAGE/HYSTEROSCOPY WITH MYOSURE N/A 02/20/2014   Procedure: DILATATION & CURETTAGE/HYSTEROSCOPY WITH MYOSURE;  Surgeon: Shanda SHAUNNA Muscat, MD;  Location: [facility];  Service: Gynecology;  Laterality: N/A;  . DILATION AND CURETTAGE OF UTERUS    . LAPAROSCOPIC GELPORT ASSISTED MYOMECTOMY    . LAPAROTOMY WITH STAGING N/A 07/01/2022   Procedure: LAPAROTOMY;  Surgeon: Viktoria Comer SAUNDERS,  MD;  Location: [facility];   Service: Gynecology;  Laterality: N/A;  . ROBOTIC ASSISTED TOTAL HYSTERECTOMY WITH BILATERAL SALPINGO OOPHERECTOMY N/A 07/01/2022   Procedure: XI ROBOTIC ASSISTED TOTAL HYSTERECTOMY WITH BILATERAL OOPHORECTOMY, UNILATERAL SALPINGECTOMY, PELVIC MASS EXCISION;  Surgeon: Viktoria Comer SAUNDERS, MD;  Location: [facility];  Service: Gynecology;  Laterality: N/A;  . SALPINGECTOMY Left    for ectopic   Medication List:  Current Outpatient Medications  Medication Sig Dispense Refill  . amoxicillin -clavulanate (AUGMENTIN ) 875-125 MG tablet Take 1 tablet by mouth 2 (two) times daily. 14 tablet 0  . b complex vitamins capsule Take 1 capsule by mouth daily.    . benzonatate  (TESSALON ) 200 MG capsule Take 1 capsule (200 mg total) by mouth 2 (two) times daily as needed for cough. 20 capsule 0  . Cholecalciferol (VITAMIN D3) 50 MCG (2000 UT) capsule Take 2,000 Units by mouth daily.    SABRA EPINEPHrine (EPIPEN 2-PAK) 0.3 mg/0.3 mL IJ SOAJ injection Inject as needed for anaphylaxis. 2 each 3  . estradiol  (ESTRACE ) 0.1 MG/GM vaginal cream Place 1 gram in vagina every other day for 14 days then twice a week for maintenance 42.5 g 6  . ferrous sulfate  325 (65 FE) MG tablet Take 1 tablet by mouth once daily. 100 tablet 5  . fluticasone  (FLONASE ) 50 MCG/ACT nasal spray Place 2 sprays into both nostrils daily. 16 g 6  . nirmatrelvir /ritonavir  (PAXLOVID , 300/100,) 20 x 150 MG & 10 x 100MG  TBPK Take 3 tablets by mouth 2 (two) times daily. 30 tablet 0  . pantoprazole  (PROTONIX ) 20 MG tablet Take 1 tablet (20 mg total) by mouth 2 (two) times daily. 60 tablet 2  . pantoprazole  (PROTONIX ) 20 MG tablet Take 1 tablet (20 mg total) by mouth 2 (two) times daily. 30 tablet 3  . predniSONE (DELTASONE) 20 MG tablet Take 3 tablets (60 mg total) by mouth daily for 3 days, THEN 2 tablets (40 mg total) daily for 3 days, THEN 1 tablet (20 mg total) daily for 2 days. 17 tablet 0  . promethazine -dextromethorphan (PROMETHAZINE -DM) 6.25-15 MG/5ML  syrup Take 5 mLs by mouth every 6 (six) hours as needed for cough 200 mL 0   No current facility-administered medications for this visit.   Allergies: No Known Allergies Social History: Social History   Socioeconomic History  . Marital status: Married    Spouse name: Not on file  . Number of children: Not on file  . Years of education: Not on file  . Highest education level: Not on file  Occupational History  . Not on file  Tobacco Use  . Smoking status: Never  . Smokeless tobacco: Never  Vaping Use  . Vaping status: Never Used  Substance and Sexual Activity  . Alcohol use: Not Currently    Comment: occasional  . Drug use: No  . Sexual activity: Yes  Other Topics Concern  . Not on file  Social History Narrative  . Not on file   Social Drivers of Health   Financial Resource Strain: Not on file  Food Insecurity: No Food Insecurity (05/14/2022)   Hunger Vital Sign   . Worried About Programme Researcher, Broadcasting/film/video in the Last Year: Never true   . Ran Out of Food in the Last Year: Never true  Transportation Needs: No Transportation Needs (05/14/2022)   PRAPARE - Transportation   . Lack of Transportation (Medical): No   . Lack of Transportation (Non-Medical): No  Physical Activity: Not on file  Stress: Not  on file  Social Connections: Socially Integrated (05/14/2022)   Social Connection and Isolation Panel   . Frequency of Communication with Friends and Family: More than three times a week   . Frequency of Social Gatherings with Friends and Family: Not on file   . Attends Religious Services: More than 4 times per year   . Active Member of Clubs or Organizations: Yes   . Attends Banker Meetings: More than 4 times per year   . Marital Status: Married   Lives in a ***. Smoking: *** Occupation: ***  Environmental History: Water  Damage/mildew in the house: {Blank single:19197::yes,no} Carpet in the family room: {Blank single:19197::yes,no} Carpet in the bedroom:  {Blank single:19197::yes,no} Heating: {Blank single:19197::electric,gas,heat pump} Cooling: {Blank single:19197::central,window,heat pump} Pet: {Blank single:19197::yes ***,no}  Family History: Family History  Problem Relation Age of Onset  . Hypertension Mother   . Depression Father   . Anxiety disorder Father   . Bipolar disorder Father   . Cancer Maternal Grandmother        breast? did die from this  . Breast cancer Maternal Grandmother   . Heart disease Paternal Grandmother   . Hypertension Paternal Grandmother   . Colon cancer Neg Hx   . Ovarian cancer Neg Hx   . Endometrial cancer Neg Hx   . Pancreatic cancer Neg Hx   . Prostate cancer Neg Hx    Problem                               Relation Asthma                                   *** Eczema                                *** Food allergy                          *** Allergic rhino conjunctivitis     ***  Review of Systems  Constitutional:  Negative for appetite change, chills, fever and unexpected weight change.  HENT:  Negative for congestion and rhinorrhea.   Eyes:  Negative for itching.  Respiratory:  Negative for cough, chest tightness, shortness of breath and wheezing.   Cardiovascular:  Negative for chest pain.  Gastrointestinal:  Negative for abdominal pain.  Genitourinary:  Negative for difficulty urinating.  Skin:  Negative for rash.  Neurological:  Negative for headaches.    Objective: LMP 02/14/2017  There is no height or weight on file to calculate BMI. Physical Exam Vitals and nursing note reviewed.  Constitutional:      Appearance: Normal appearance. She is well-developed.  HENT:     Head: Normocephalic and atraumatic.     Right Ear: Tympanic membrane and external ear normal.     Left Ear: Tympanic membrane and external ear normal.     Nose: Nose normal.     Mouth/Throat:     Mouth: Mucous membranes are moist.     Pharynx: Oropharynx is clear.  Eyes:      Conjunctiva/sclera: Conjunctivae normal.  Cardiovascular:     Rate and Rhythm: Normal rate and regular rhythm.     Heart sounds: Normal heart sounds. No murmur heard.    No friction rub. No  gallop.  Pulmonary:     Effort: Pulmonary effort is normal.     Breath sounds: Normal breath sounds. No wheezing, rhonchi or rales.  Musculoskeletal:     Cervical back: Neck supple.  Skin:    General: Skin is warm.     Findings: No rash.  Neurological:     Mental Status: She is alert and oriented to person, place, and time.  Psychiatric:        Behavior: Behavior normal.   The plan was reviewed with the patient/family, and all questions/concerned were addressed.  It was my pleasure to see Nefertiti today and participate in her care. Please feel free to contact me with any questions or concerns.  Sincerely,  Orlan Cramp, DO Allergy & Immunology  [facility]  [employer] office: [phone] [facility] office: [phone]

## 2024-02-08 ENCOUNTER — Other Ambulatory Visit (HOSPITAL_BASED_OUTPATIENT_CLINIC_OR_DEPARTMENT_OTHER): Payer: Self-pay

## 2024-02-08 ENCOUNTER — Ambulatory Visit: Admitting: Allergy

## 2024-02-08 ENCOUNTER — Encounter: Payer: Self-pay | Admitting: Allergy

## 2024-02-08 VITALS — BP 118/86 | HR 92 | Temp 98.2°F | Resp 18 | Ht 63.0 in | Wt 219.0 lb

## 2024-02-08 DIAGNOSIS — T783XXA Angioneurotic edema, initial encounter: Secondary | ICD-10-CM

## 2024-02-08 DIAGNOSIS — L508 Other urticaria: Secondary | ICD-10-CM

## 2024-02-08 MED ORDER — HYDROXYZINE HCL 25 MG PO TABS
25.0000 mg | ORAL_TABLET | Freq: Every evening | ORAL | 1 refills | Status: DC | PRN
  Filled 2024-02-08: qty 30, 30d supply, fill #0
  Filled 2024-03-07: qty 30, 30d supply, fill #1

## 2024-02-08 MED ORDER — METHYLPREDNISOLONE ACETATE 40 MG/ML IJ SUSP
40.0000 mg | Freq: Once | INTRAMUSCULAR | Status: AC
  Administered 2024-02-08: 40 mg via INTRAMUSCULAR

## 2024-02-08 MED ORDER — MONTELUKAST SODIUM 10 MG PO TABS
10.0000 mg | ORAL_TABLET | Freq: Every day | ORAL | 2 refills | Status: AC
  Filled 2024-02-08: qty 30, 30d supply, fill #0
  Filled 2024-03-07: qty 30, 30d supply, fill #1
  Filled 2024-04-18: qty 30, 30d supply, fill #2

## 2024-02-08 MED ORDER — CETIRIZINE HCL 10 MG PO TABS
10.0000 mg | ORAL_TABLET | Freq: Two times a day (BID) | ORAL | 2 refills | Status: AC
  Filled 2024-02-08: qty 60, 30d supply, fill #0
  Filled 2024-03-07: qty 60, 30d supply, fill #1
  Filled 2024-04-28: qty 60, 30d supply, fill #2

## 2024-02-08 MED ORDER — FAMOTIDINE 20 MG PO TABS
20.0000 mg | ORAL_TABLET | Freq: Two times a day (BID) | ORAL | 2 refills | Status: AC
  Filled 2024-02-08: qty 60, 30d supply, fill #0
  Filled 2024-03-07: qty 60, 30d supply, fill #1
  Filled 2024-04-18: qty 60, 30d supply, fill #2

## 2024-02-08 NOTE — Patient Instructions (Addendum)
 Hives: Depo 40mg  injection given today.  Based on clinical history, she likely has chronic idiopathic urticaria. Discussed with patient, that urticaria is usually caused by release of histamine by cutaneous mast cells but sometimes it is non-histamine mediated. Explained that urticaria is not always associated with allergies. In most cases, the exact etiology for urticaria can not be established and it is considered idiopathic. Discussed the role of adding omalizumab  (anti-IgE antibodies) in controlling urticaria in refractory patients. Informational pamphlet given.  Start zyrtec (cetirizine) 10mg  OR allegra (fexofenadine) 180mg  twice a day. If symptoms are not controlled or causes drowsiness let us  know. Start Pepcid (famotidine) 20mg  twice a day.  Start Singulair (montelukast) 10mg  daily at night. Cautioned that in some children/adults can experience behavioral changes including hyperactivity, agitation, depression, sleep disturbances and suicidal ideations. These side effects are rare, but if you notice them you should notify me and discontinue Singulair (montelukast). May take hydroxyzine 25mg  1 hour before bedtime as needed for itching.  Avoid the following potential triggers: alcohol, tight clothing, NSAIDs, hot showers and getting overheated. See below for proper skin care.   Get bloodwork. We are ordering labs, so please allow 1-2 weeks for the results to come back. With the newly implemented Cures Act, the labs might be visible to you at the same time that they become visible to me. However, I will not address the results until all of the results are back, so please be patient.   Return in about 4 weeks (around 03/07/2024). Or sooner if needed.    Skin care recommendations  Bath time: Always use lukewarm water . AVOID very hot or cold water . Keep bathing time to 5-10 minutes. Do NOT use bubble bath. Use a mild soap and use just enough to wash the dirty areas. Do NOT scrub skin  vigorously.  After bathing, pat dry your skin with a towel. Do NOT rub or scrub the skin.  Moisturizers and prescriptions:  ALWAYS apply moisturizers immediately after bathing (within 3 minutes). This helps to lock-in moisture. Use the moisturizer several times a day over the whole body. Good summer moisturizers include: Aveeno, CeraVe, Cetaphil. Good winter moisturizers include: Aquaphor, Vaseline, Cerave, Cetaphil, Eucerin, Vanicream. When using moisturizers along with medications, the moisturizer should be applied about one hour after applying the medication to prevent diluting effect of the medication or moisturize around where you applied the medications. When not using medications, the moisturizer can be continued twice daily as maintenance.  Laundry and clothing: Avoid laundry products with added color or perfumes. Use unscented hypo-allergenic laundry products such as Tide free, Cheer free & gentle, and All free and clear.  If the skin still seems dry or sensitive, you can try double-rinsing the clothes. Avoid tight or scratchy clothing such as wool. Do not use fabric softeners or dyer sheets.

## 2024-02-09 ENCOUNTER — Encounter: Payer: Self-pay | Admitting: Allergy

## 2024-02-09 DIAGNOSIS — D509 Iron deficiency anemia, unspecified: Secondary | ICD-10-CM | POA: Diagnosis not present

## 2024-02-10 ENCOUNTER — Other Ambulatory Visit (HOSPITAL_COMMUNITY): Payer: Self-pay

## 2024-02-10 ENCOUNTER — Other Ambulatory Visit (HOSPITAL_BASED_OUTPATIENT_CLINIC_OR_DEPARTMENT_OTHER): Payer: Self-pay

## 2024-02-10 MED ORDER — FLUZONE 0.5 ML IM SUSY
0.5000 mL | PREFILLED_SYRINGE | Freq: Once | INTRAMUSCULAR | 0 refills | Status: AC
  Filled 2024-02-10: qty 0.5, 1d supply, fill #0

## 2024-02-10 MED ORDER — LINZESS 72 MCG PO CAPS
72.0000 ug | ORAL_CAPSULE | Freq: Every day | ORAL | 3 refills | Status: AC
  Filled 2024-02-10 (×2): qty 30, 30d supply, fill #0

## 2024-02-11 ENCOUNTER — Other Ambulatory Visit (HOSPITAL_BASED_OUTPATIENT_CLINIC_OR_DEPARTMENT_OTHER): Payer: Self-pay

## 2024-02-14 ENCOUNTER — Other Ambulatory Visit (HOSPITAL_COMMUNITY): Payer: Self-pay | Admitting: Gastroenterology

## 2024-02-14 DIAGNOSIS — L509 Urticaria, unspecified: Secondary | ICD-10-CM | POA: Diagnosis not present

## 2024-02-14 DIAGNOSIS — E6609 Other obesity due to excess calories: Secondary | ICD-10-CM | POA: Diagnosis not present

## 2024-02-14 DIAGNOSIS — D509 Iron deficiency anemia, unspecified: Secondary | ICD-10-CM

## 2024-02-14 DIAGNOSIS — Z6839 Body mass index (BMI) 39.0-39.9, adult: Secondary | ICD-10-CM | POA: Diagnosis not present

## 2024-02-14 DIAGNOSIS — E66812 Obesity, class 2: Secondary | ICD-10-CM | POA: Diagnosis not present

## 2024-02-16 ENCOUNTER — Ambulatory Visit: Payer: Self-pay | Admitting: Allergy

## 2024-02-17 LAB — C1 ESTERASE INHIBITOR: C1INH SerPl-mCnc: 41 mg/dL — ABNORMAL HIGH (ref 21–39)

## 2024-02-17 LAB — COMPREHENSIVE METABOLIC PANEL WITH GFR
AST: 10 IU/L (ref 0–40)
Albumin: 4 g/dL (ref 3.8–4.9)
Alkaline Phosphatase: 77 IU/L (ref 49–135)
BUN/Creatinine Ratio: 22 (ref 9–23)
BUN: 17 mg/dL (ref 6–24)
Bilirubin Total: 0.4 mg/dL (ref 0.0–1.2)
CO2: 23 mmol/L (ref 20–29)
Calcium: 9.1 mg/dL (ref 8.7–10.2)
Chloride: 98 mmol/L (ref 96–106)
Creatinine, Ser: 0.79 mg/dL (ref 0.57–1.00)
Globulin, Total: 2.4 g/dL (ref 1.5–4.5)
Glucose: 100 mg/dL — ABNORMAL HIGH (ref 70–99)
Potassium: 3.9 mmol/L (ref 3.5–5.2)
Sodium: 136 mmol/L (ref 134–144)
Total Protein: 6.4 g/dL (ref 6.0–8.5)
eGFR: 87 mL/min/1.73 (ref >59)

## 2024-02-17 LAB — PROTEIN ELECTROPHORESIS, SERUM
A/G Ratio: 1 (ref 0.7–1.7)
Albumin ELP: 3.2 g/dL (ref 2.9–4.4)
Alpha 1: 0.3 g/dL (ref 0.0–0.4)
Alpha 2: 0.8 g/dL (ref 0.4–1.0)
Beta: 1 g/dL (ref 0.7–1.3)
Gamma Globulin: 1 g/dL (ref 0.4–1.8)
Globulin, Total: 3.2 g/dL (ref 2.2–3.9)

## 2024-02-17 LAB — CHRONIC URTICARIA (CU) EVAL
ALT: 11 IU/L (ref 0–32)
Basophils Absolute: 0 x10E3/uL (ref 0.0–0.2)
Basos: 0 %
CRP: 33 mg/L — ABNORMAL HIGH (ref 0–10)
EOS (ABSOLUTE): 0.1 x10E3/uL (ref 0.0–0.4)
Eos: 1 %
Hematocrit: 34.8 % (ref 34.0–46.6)
Hemoglobin: 10.9 g/dL — ABNORMAL LOW (ref 11.1–15.9)
Immature Grans (Abs): 0.1 x10E3/uL (ref 0.0–0.1)
Immature Granulocytes: 1 %
Lymphocytes Absolute: 2.6 x10E3/uL (ref 0.7–3.1)
Lymphs: 19 %
MCH: 28.8 pg (ref 26.6–33.0)
MCHC: 31.3 g/dL — ABNORMAL LOW (ref 31.5–35.7)
MCV: 92 fL (ref 79–97)
Monocytes Absolute: 0.4 x10E3/uL (ref 0.1–0.9)
Monocytes: 3 %
Neutrophils Absolute: 10.6 x10E3/uL — ABNORMAL HIGH (ref 1.4–7.0)
Neutrophils: 76 %
Platelets: 374 x10E3/uL (ref 150–450)
Pooled Donor- BAT CU: 9.4 % (ref 0.00–10.60)
RBC: 3.78 x10E6/uL (ref 3.77–5.28)
RDW: 13.4 % (ref 11.7–15.4)
Sed Rate: 56 mm/h — ABNORMAL HIGH (ref 0–40)
TSH: 1.1 u[IU]/mL (ref 0.450–4.500)
Thyroperoxidase Ab SerPl-aCnc: 11 [IU]/mL (ref 0–34)
WBC: 13.8 x10E3/uL — ABNORMAL HIGH (ref 3.4–10.8)

## 2024-02-17 LAB — PROTEIN ELECTROPHORESIS, URINE REFLEX
Albumin ELP, Urine: 31.2 %
Alpha-1-Globulin, U: 3.7 %
Alpha-2-Globulin, U: 14.3 %
Beta Globulin, U: 30.5 %
Gamma Globulin, U: 20.3 %
Protein, Ur: 6.6 mg/dL

## 2024-02-17 LAB — ALPHA-GAL PANEL
Allergen Lamb IgE: <0.1 kU/L
Beef IgE: <0.1 kU/L
IgE (Immunoglobulin E), Serum: 13 [IU]/mL (ref 6–495)
O215-IgE Alpha-Gal: <0.1 kU/L
Pork IgE: <0.1 kU/L

## 2024-02-17 LAB — ANTINUCLEAR ANTIBODIES, IFA: ANA Titer 1: NEGATIVE

## 2024-02-17 LAB — TRYPTASE: Tryptase: 6.1 ug/L (ref 2.2–13.2)

## 2024-02-17 LAB — C1 ESTERASE INHIBITOR, FUNCTIONAL: C1INH Functional/C1INH Total MFr SerPl: >105 %{normal}

## 2024-02-17 LAB — C3 AND C4
Complement C3, Serum: 166 mg/dL (ref 82–167)
Complement C4, Serum: 30 mg/dL (ref 12–38)

## 2024-02-17 LAB — COMPLEMENT COMPONENT C1Q: Complement C1Q: 13.9 mg/dL (ref 10.3–20.5)

## 2024-02-21 DIAGNOSIS — Z90710 Acquired absence of both cervix and uterus: Secondary | ICD-10-CM | POA: Diagnosis not present

## 2024-02-21 DIAGNOSIS — N941 Unspecified dyspareunia: Secondary | ICD-10-CM | POA: Diagnosis not present

## 2024-02-21 DIAGNOSIS — Z1330 Encounter for screening examination for mental health and behavioral disorders, unspecified: Secondary | ICD-10-CM | POA: Diagnosis not present

## 2024-02-21 DIAGNOSIS — Z1231 Encounter for screening mammogram for malignant neoplasm of breast: Secondary | ICD-10-CM | POA: Diagnosis not present

## 2024-02-21 DIAGNOSIS — Z01419 Encounter for gynecological examination (general) (routine) without abnormal findings: Secondary | ICD-10-CM | POA: Diagnosis not present

## 2024-02-21 DIAGNOSIS — Z1211 Encounter for screening for malignant neoplasm of colon: Secondary | ICD-10-CM | POA: Diagnosis not present

## 2024-02-22 ENCOUNTER — Ambulatory Visit
Admission: RE | Admit: 2024-02-22 | Discharge: 2024-02-22 | Disposition: A | Discharge status: Home / Self Care | Source: Ambulatory Visit | Attending: Obstetrics and Gynecology | Admitting: Obstetrics and Gynecology

## 2024-02-22 DIAGNOSIS — Z1231 Encounter for screening mammogram for malignant neoplasm of breast: Secondary | ICD-10-CM

## 2024-02-24 ENCOUNTER — Other Ambulatory Visit: Payer: Self-pay | Admitting: Obstetrics and Gynecology

## 2024-02-24 ENCOUNTER — Ambulatory Visit (HOSPITAL_BASED_OUTPATIENT_CLINIC_OR_DEPARTMENT_OTHER)
Admission: RE | Admit: 2024-02-24 | Discharge: 2024-02-24 | Disposition: A | Discharge status: Home / Self Care | Source: Ambulatory Visit | Attending: Gastroenterology | Admitting: Gastroenterology

## 2024-02-24 DIAGNOSIS — R928 Other abnormal and inconclusive findings on diagnostic imaging of breast: Secondary | ICD-10-CM

## 2024-02-24 DIAGNOSIS — D509 Iron deficiency anemia, unspecified: Secondary | ICD-10-CM | POA: Insufficient documentation

## 2024-02-26 ENCOUNTER — Other Ambulatory Visit (HOSPITAL_BASED_OUTPATIENT_CLINIC_OR_DEPARTMENT_OTHER): Payer: Self-pay

## 2024-02-28 DIAGNOSIS — D509 Iron deficiency anemia, unspecified: Secondary | ICD-10-CM | POA: Diagnosis not present

## 2024-02-29 ENCOUNTER — Other Ambulatory Visit (HOSPITAL_BASED_OUTPATIENT_CLINIC_OR_DEPARTMENT_OTHER): Payer: Self-pay

## 2024-03-02 ENCOUNTER — Other Ambulatory Visit: Payer: Self-pay

## 2024-03-02 ENCOUNTER — Encounter: Payer: Self-pay | Admitting: Allergy

## 2024-03-02 ENCOUNTER — Ambulatory Visit: Admitting: Allergy

## 2024-03-02 VITALS — BP 112/82 | HR 88 | Temp 98.2°F | Resp 16 | Ht 64.5 in | Wt 224.0 lb

## 2024-03-02 DIAGNOSIS — L508 Other urticaria: Secondary | ICD-10-CM | POA: Diagnosis not present

## 2024-03-02 DIAGNOSIS — T783XXD Angioneurotic edema, subsequent encounter: Secondary | ICD-10-CM

## 2024-03-02 NOTE — Patient Instructions (Addendum)
 Hives: Based on clinical history, she likely has chronic idiopathic urticaria. Discussed with patient, that urticaria is usually caused by release of histamine by cutaneous mast cells but sometimes it is non-histamine mediated. Explained that urticaria is not always associated with allergies. In most cases, the exact etiology for urticaria can not be established and it is considered idiopathic.  Start Xolair 300mg  injections ever 4 weeks. Sample dose will be given on Tuesday at 1:30PM. [facility] will be in touch with you regarding coverage. Consent form was signed.  Continue zyrtec  (cetirizine ) 10mg  OR allegra (fexofenadine) 180mg  twice a day. If symptoms are not controlled or causes drowsiness let us  know. Continue Pepcid  (famotidine ) 20mg  twice a day.  Continue Singulair  (montelukast ) 10mg  daily at night. May take hydroxyzine  25mg  1 hour before bedtime as needed for itching.  Avoid the following potential triggers: alcohol, tight clothing, NSAIDs, hot showers and getting overheated.  Return in about 3 months (around 06/02/2024). Or sooner if needed.

## 2024-03-02 NOTE — Progress Notes (Signed)
 Follow Up Note  RE: Sarah Villa MRN: [id number] DOB: 1965-10-24 Date of Office Visit: 03/02/2024  Referring provider: Teresa Channel, MD Primary care provider: Teresa Channel, MD  Chief Complaint: Follow-up and Urticaria (Itching and more worst at night ans last 24 hours)  History of Present Illness: I had the pleasure of seeing Sarah Villa for a follow up visit at the [facility] on 03/02/2024. She is a 58 y.o. female, who is being followed for CIU with angioedema. Her previous allergy office visit was on 02/08/2024 with Dr. Luke. Today is a new complaint visit of persistent hives.  Discussed the use of AI scribe software for clinical note transcription with the patient, who gave verbal consent to proceed.    She experiences daily hives, which are less severe than before. The hives manifest as small spots or 'pockets' on her back, under her breasts, and occasionally on her legs and feet. They are less massive than previously.  Her current medication regimen includes Singulair , hydroxyzine , Zyrtec  twice a day, and Pepcid  twice a day. She is diligent with her medications, which has led to some improvement, but she continues to experience daily hives.  She has a history of H. pylori infection treated last year, which was successfully cleared.   She previously received a prednisone  shot, which provided temporary relief, but the hives returned, albeit less severely. She has not required further steroid treatments recently.  Her hives can be exacerbated by getting overheated. She also mentions experiencing more hot flashes post-hysterectomy, which may contribute to her symptoms.      Patient planning on getting flu shot in December.   2025 labs: Blood count, kidney function, liver function, electrolytes, thyroid , autoimmune screener, chronic urticaria index (checks for autoantibodies that trigger mast cells), angioedema panel (checks for certain swelling issues), urine  test, tryptase (checks for mast cell issues) and alpha gal (checks for red meat allergy) were all normal which is great.    Your inflammation markers and WBC were slightly elevated - most likely due to your recent steroid as that can make these values go up slightly. This is not concerning.    Based on the lab results, no underlying issues.   Follow up at the end of November. If you are doing well and no more hive/swelling episode then you can also get your flu shot then.  Assessment and Plan: Sarah Villa is a 58 y.o. female with: Chronic idiopathic urticaria with angioedema Past history - chronic idiopathic urticaria with angioedema - possibly induced by her recent Covid-19 infection. Steroids provided temporary relief but oral steroids flared GERD. No specific triggers identified. Not on ace-inh, no family history of hives/angioedema.  Interim history - Persistent daily hives with improvement on current regimen. No more angioedema episodes. Considering Xolair due to ongoing symptoms. 2025 labs unremarkable.  Based on clinical history, she likely has chronic idiopathic urticaria. Discussed with patient, that urticaria is usually caused by release of histamine by cutaneous mast cells but sometimes it is non-histamine mediated. Explained that urticaria is not always associated with allergies. In most cases, the exact etiology for urticaria can not be established and it is considered idiopathic. Start Xolair 300mg  injections ever 4 weeks. Sample dose will be given on Tuesday at 1:30PM. [facility] will be in touch with you regarding coverage. Consent form was signed. Continue zyrtec  (cetirizine ) 10mg  OR allegra (fexofenadine) 180mg  twice a day. If symptoms are not controlled or causes drowsiness let us  know.  Continue Pepcid (famotidine) 20mg  twice a day.  Continue Singulair (montelukast) 10mg  daily at night. May take hydroxyzine 25mg  1 hour before bedtime as needed for itching.  Avoid the  following potential triggers: alcohol, tight clothing, NSAIDs, hot showers and getting overheated.  Return in about 3 months (around 06/02/2024).  No orders of the defined types were placed in this encounter.  Lab Orders  No laboratory test(s) ordered today    Diagnostics: None.   Medication List:  Current Outpatient Medications  Medication Sig Dispense Refill   b complex vitamins capsule Take 1 capsule by mouth daily.     cetirizine (ZYRTEC ALLERGY) 10 MG tablet Take 1 tablet (10 mg total) by mouth 2 (two) times daily. 60 tablet 2   Cholecalciferol (VITAMIN D3) 50 MCG (2000 UT) capsule Take 2,000 Units by mouth daily.     EPINEPHrine (EPIPEN 2-PAK) 0.3 mg/0.3 mL IJ SOAJ injection Inject as needed for anaphylaxis. 2 each 3   estradiol  (ESTRACE ) 0.1 MG/GM vaginal cream Place 1 gram in vagina every other day for 14 days then twice a week for maintenance 42.5 g 6   famotidine (PEPCID) 20 MG tablet Take 1 tablet (20 mg total) by mouth 2 (two) times daily. 60 tablet 2   ferrous sulfate  325 (65 FE) MG tablet Take 1 tablet by mouth once daily. 100 tablet 5   fluticasone  (FLONASE ) 50 MCG/ACT nasal spray Place 2 sprays into both nostrils daily. 16 g 6   hydrOXYzine (ATARAX) 25 MG tablet Take 1 tablet (25 mg total) by mouth at bedtime as needed for itching. 30 tablet 1   linaclotide (LINZESS) 72 MCG capsule Take 1 capsule (72 mcg total) by mouth at least 30 minutes before the first meal of the day on an empty stomach 30 capsule 3   montelukast (SINGULAIR) 10 MG tablet Take 1 tablet (10 mg total) by mouth at bedtime. 30 tablet 2   pantoprazole  (PROTONIX ) 20 MG tablet Take 1 tablet (20 mg total) by mouth 2 (two) times daily. 30 tablet 3   promethazine -dextromethorphan (PROMETHAZINE -DM) 6.25-15 MG/5ML syrup Take 5 mLs by mouth every 6 (six) hours as needed for cough 200 mL 0   No current facility-administered medications for this visit.   Allergies: No Known Allergies I reviewed her past medical  history, social history, family history, and environmental history and no significant changes have been reported from her previous visit.  Review of Systems  Constitutional:  Negative for appetite change, chills, fever and unexpected weight change.  HENT:  Negative for congestion and rhinorrhea.   Eyes:  Negative for itching.  Respiratory:  Negative for cough, chest tightness, shortness of breath and wheezing.   Cardiovascular:  Negative for chest pain.  Gastrointestinal:  Negative for abdominal pain.  Genitourinary:  Negative for difficulty urinating.  Skin:  Positive for rash.  Neurological:  Negative for headaches.    Objective: BP 112/82 (BP Location: Left Arm, Patient Position: Sitting, Cuff Size: Normal)   Pulse 88   Temp 98.2 F (36.8 C) (Temporal)   Resp 16   Ht 5' 4.5 (1.638 m)   Wt 224 lb (101.6 kg)   LMP 02/14/2017   SpO2 98%   BMI 37.86 kg/m  Body mass index is 37.86 kg/m. Physical Exam Vitals and nursing note reviewed.  Constitutional:      Appearance: Normal appearance. She is well-developed.  HENT:     Head: Normocephalic and atraumatic.     Right Ear: Tympanic membrane and external ear normal.  Left Ear: Tympanic membrane and external ear normal.     Nose: Nose normal.     Mouth/Throat:     Mouth: Mucous membranes are moist.     Pharynx: Oropharynx is clear.  Eyes:     Conjunctiva/sclera: Conjunctivae normal.  Cardiovascular:     Rate and Rhythm: Normal rate and regular rhythm.     Heart sounds: Normal heart sounds. No murmur heard.    No friction rub. No gallop.  Pulmonary:     Effort: Pulmonary effort is normal.     Breath sounds: Normal breath sounds. No wheezing, rhonchi or rales.  Musculoskeletal:     Cervical back: Neck supple.  Skin:    General: Skin is warm.     Findings: Rash present.     Comments: Few areas of erythema under the breasts b/l.  Neurological:     Mental Status: She is alert and oriented to person, place, and time.   Psychiatric:        Behavior: Behavior normal.    Previous notes and tests were reviewed. The plan was reviewed with the patient/family, and all questions/concerned were addressed.  It was my pleasure to see Sarah Villa today and participate in her care. Please feel free to contact me with any questions or concerns.  Sincerely,  Orlan Cramp, DO Allergy & Immunology  [facility]  [facility] office: [phone] [facility] office: [phone]

## 2024-03-03 ENCOUNTER — Other Ambulatory Visit (HOSPITAL_BASED_OUTPATIENT_CLINIC_OR_DEPARTMENT_OTHER): Payer: Self-pay

## 2024-03-03 MED ORDER — ESTRADIOL 0.01 % VA CREA
TOPICAL_CREAM | VAGINAL | 0 refills | Status: DC
  Filled 2024-03-03: qty 42.5, 74d supply, fill #0

## 2024-03-06 ENCOUNTER — Other Ambulatory Visit: Payer: Self-pay

## 2024-03-06 ENCOUNTER — Telehealth: Payer: Self-pay

## 2024-03-06 ENCOUNTER — Other Ambulatory Visit (HOSPITAL_COMMUNITY): Payer: Self-pay

## 2024-03-06 ENCOUNTER — Other Ambulatory Visit (HOSPITAL_BASED_OUTPATIENT_CLINIC_OR_DEPARTMENT_OTHER): Payer: Self-pay

## 2024-03-06 NOTE — Telephone Encounter (Signed)
 Pharmacy Patient Advocate Encounter   Received notification from Pt Calls Messages that prior authorization for Xolair 300MG /2ML auto-injectors   is required/requested.   Insurance verification completed.   The patient is insured through [facility].   Per test claim: PA required; PA submitted to above mentioned insurance via Latent Key/confirmation #/EOC [id number] Status is pending

## 2024-03-06 NOTE — Telephone Encounter (Signed)
 PA submitted, pending determination.

## 2024-03-06 NOTE — Addendum Note (Signed)
 Addended by: OTHA MADELIN HERO on: 03/06/2024 08:59 AM   Modules accepted: Orders

## 2024-03-06 NOTE — Telephone Encounter (Signed)
 Patient to initiate Xolair 300mg  every 4 weeks for idiopathic urticaria. Sample dose to be given 03/07/24.

## 2024-03-07 ENCOUNTER — Encounter

## 2024-03-07 ENCOUNTER — Ambulatory Visit

## 2024-03-07 ENCOUNTER — Other Ambulatory Visit (HOSPITAL_BASED_OUTPATIENT_CLINIC_OR_DEPARTMENT_OTHER): Payer: Self-pay

## 2024-03-07 ENCOUNTER — Ambulatory Visit
Admission: RE | Admit: 2024-03-07 | Discharge: 2024-03-07 | Disposition: A | Source: Ambulatory Visit | Attending: Obstetrics and Gynecology | Admitting: Obstetrics and Gynecology

## 2024-03-07 DIAGNOSIS — R928 Other abnormal and inconclusive findings on diagnostic imaging of breast: Secondary | ICD-10-CM

## 2024-03-07 DIAGNOSIS — R921 Mammographic calcification found on diagnostic imaging of breast: Secondary | ICD-10-CM | POA: Diagnosis not present

## 2024-03-08 ENCOUNTER — Other Ambulatory Visit (HOSPITAL_COMMUNITY): Payer: Self-pay

## 2024-03-08 NOTE — Telephone Encounter (Signed)
 Your request has been approved The request has been approved. The authorization is effective from 03/08/2024 to 09/04/2024, as long as the member is enrolled in their current health plan. The request was approved as submitted. This request has been approved for 2mL per 28 days. An additional authorization has been entered for Xolair 300mg /7mL syringe with a quantity limit of 2mL per 28 days that is effective from 03/08/2024 and is valid until 09/04/2024; please reference authorization [id number].Please note: This medication must be filled at [facility]. Please call [phone] for assistance. A written notification letter will follow with additional details. Authorization Expiration05/25/2026

## 2024-03-08 NOTE — Telephone Encounter (Signed)
 Test claim- $250.00, looking into possible copay card.

## 2024-03-14 ENCOUNTER — Other Ambulatory Visit: Payer: Self-pay

## 2024-03-14 ENCOUNTER — Other Ambulatory Visit (HOSPITAL_COMMUNITY): Payer: Self-pay

## 2024-03-14 NOTE — Telephone Encounter (Signed)
 Xolair Co-Pay  Bin: [id number] PCN: [id number] Group: [id number] ID: [id number]  *test claim confirms at this time- $0.00 copay with insurance and copay card

## 2024-03-29 ENCOUNTER — Other Ambulatory Visit (HOSPITAL_BASED_OUTPATIENT_CLINIC_OR_DEPARTMENT_OTHER): Payer: Self-pay

## 2024-03-29 ENCOUNTER — Other Ambulatory Visit: Payer: Self-pay | Admitting: Allergy

## 2024-03-30 ENCOUNTER — Other Ambulatory Visit (HOSPITAL_BASED_OUTPATIENT_CLINIC_OR_DEPARTMENT_OTHER): Payer: Self-pay

## 2024-03-30 ENCOUNTER — Other Ambulatory Visit: Payer: Self-pay

## 2024-03-30 MED ORDER — HYDROXYZINE HCL 25 MG PO TABS
25.0000 mg | ORAL_TABLET | Freq: Every evening | ORAL | 1 refills | Status: AC | PRN
  Filled 2024-03-30 – 2024-04-18 (×2): qty 30, 30d supply, fill #0
  Filled ????-??-??: fill #0

## 2024-03-31 ENCOUNTER — Other Ambulatory Visit (HOSPITAL_BASED_OUTPATIENT_CLINIC_OR_DEPARTMENT_OTHER): Payer: Self-pay

## 2024-04-11 ENCOUNTER — Other Ambulatory Visit (HOSPITAL_BASED_OUTPATIENT_CLINIC_OR_DEPARTMENT_OTHER): Payer: Self-pay

## 2024-04-18 ENCOUNTER — Other Ambulatory Visit (HOSPITAL_BASED_OUTPATIENT_CLINIC_OR_DEPARTMENT_OTHER): Payer: Self-pay

## 2024-04-28 ENCOUNTER — Other Ambulatory Visit (HOSPITAL_BASED_OUTPATIENT_CLINIC_OR_DEPARTMENT_OTHER): Payer: Self-pay

## 2024-04-28 ENCOUNTER — Other Ambulatory Visit: Payer: Self-pay

## 2024-04-28 MED ORDER — ESTRADIOL 0.01 % VA CREA
1.0000 | TOPICAL_CREAM | VAGINAL | 6 refills | Status: AC
  Filled 2024-04-28: qty 42.5, 90d supply, fill #0

## 2024-05-12 ENCOUNTER — Other Ambulatory Visit (HOSPITAL_BASED_OUTPATIENT_CLINIC_OR_DEPARTMENT_OTHER): Payer: Self-pay

## 2024-05-12 MED ORDER — PANTOPRAZOLE SODIUM 40 MG PO TBEC
40.0000 mg | DELAYED_RELEASE_TABLET | Freq: Every day | ORAL | 3 refills | Status: AC
  Filled 2024-05-12: qty 90, 90d supply, fill #0

## 2024-05-16 ENCOUNTER — Encounter: Payer: Self-pay | Admitting: Allergy

## 2024-06-01 ENCOUNTER — Ambulatory Visit: Admitting: Allergy
